# Patient Record
Sex: Female | Born: 1975 | Race: Black or African American | Hispanic: No | Marital: Single | Smoking: Current every day smoker
Health system: Southern US, Community
[De-identification: ages and names within clinical notes are randomized; demographics above are authoritative.]

## PROBLEM LIST (undated history)

## (undated) DIAGNOSIS — D649 Anemia, unspecified: Secondary | ICD-10-CM

## (undated) DIAGNOSIS — G43909 Migraine, unspecified, not intractable, without status migrainosus: Secondary | ICD-10-CM

## (undated) DIAGNOSIS — R161 Splenomegaly, not elsewhere classified: Secondary | ICD-10-CM

## (undated) DIAGNOSIS — I864 Gastric varices: Secondary | ICD-10-CM

## (undated) DIAGNOSIS — K389 Disease of appendix, unspecified: Secondary | ICD-10-CM

## (undated) DIAGNOSIS — K766 Portal hypertension: Secondary | ICD-10-CM

## (undated) DIAGNOSIS — I8500 Esophageal varices without bleeding: Secondary | ICD-10-CM

## (undated) DIAGNOSIS — Q442 Atresia of bile ducts: Secondary | ICD-10-CM

## (undated) DIAGNOSIS — K7460 Unspecified cirrhosis of liver: Secondary | ICD-10-CM

## (undated) HISTORY — DX: Portal hypertension: K76.6

## (undated) HISTORY — DX: Disease of appendix, unspecified: K38.9

## (undated) HISTORY — DX: Gastric varices: I86.4

## (undated) HISTORY — DX: Unspecified cirrhosis of liver: K74.60

## (undated) HISTORY — PX: CHOLECYSTECTOMY: SHX55

## (undated) HISTORY — DX: Splenomegaly, not elsewhere classified: R16.1

## (undated) HISTORY — PX: OTHER SURGICAL HISTORY: SHX169

## (undated) HISTORY — PX: LIVER SURGERY: SHX698

## (undated) HISTORY — DX: Migraine, unspecified, not intractable, without status migrainosus: G43.909

## (undated) HISTORY — PX: HAND SURGERY: SHX662

## (undated) HISTORY — DX: Atresia of bile ducts: Q44.2

## (undated) HISTORY — PX: OVARIAN CYST REMOVAL: SHX89

## (undated) HISTORY — DX: Esophageal varices without bleeding: I85.00

---

## 1998-02-11 ENCOUNTER — Other Ambulatory Visit: Admission: RE | Admit: 1998-02-11 | Discharge: 1998-02-11 | Payer: Self-pay | Admitting: Obstetrics

## 1998-03-12 ENCOUNTER — Ambulatory Visit (HOSPITAL_COMMUNITY): Admission: RE | Admit: 1998-03-12 | Discharge: 1998-03-12 | Payer: Self-pay | Admitting: Obstetrics

## 1998-04-10 ENCOUNTER — Encounter: Payer: Self-pay | Admitting: Obstetrics

## 1998-04-10 ENCOUNTER — Ambulatory Visit (HOSPITAL_COMMUNITY): Admission: RE | Admit: 1998-04-10 | Discharge: 1998-04-10 | Payer: Self-pay | Admitting: Obstetrics

## 1998-04-30 ENCOUNTER — Ambulatory Visit (HOSPITAL_COMMUNITY): Admission: RE | Admit: 1998-04-30 | Discharge: 1998-04-30 | Payer: Self-pay | Admitting: Obstetrics

## 1998-04-30 ENCOUNTER — Encounter: Payer: Self-pay | Admitting: Obstetrics

## 1998-05-02 ENCOUNTER — Inpatient Hospital Stay (HOSPITAL_COMMUNITY): Admission: RE | Admit: 1998-05-02 | Discharge: 1998-05-05 | Payer: Self-pay | Admitting: Obstetrics

## 1999-01-01 ENCOUNTER — Encounter: Payer: Self-pay | Admitting: Obstetrics

## 1999-01-01 ENCOUNTER — Inpatient Hospital Stay (HOSPITAL_COMMUNITY): Admission: AD | Admit: 1999-01-01 | Discharge: 1999-01-01 | Payer: Self-pay | Admitting: Obstetrics

## 1999-01-19 ENCOUNTER — Other Ambulatory Visit: Admission: RE | Admit: 1999-01-19 | Discharge: 1999-01-19 | Payer: Self-pay | Admitting: Obstetrics

## 1999-08-22 ENCOUNTER — Emergency Department (HOSPITAL_COMMUNITY): Admission: EM | Admit: 1999-08-22 | Discharge: 1999-08-22 | Payer: Self-pay | Admitting: *Deleted

## 2000-01-28 ENCOUNTER — Inpatient Hospital Stay (HOSPITAL_COMMUNITY): Admission: AD | Admit: 2000-01-28 | Discharge: 2000-01-28 | Payer: Self-pay | Admitting: *Deleted

## 2000-10-15 ENCOUNTER — Inpatient Hospital Stay (HOSPITAL_COMMUNITY): Admission: EM | Admit: 2000-10-15 | Discharge: 2000-10-19 | Payer: Self-pay | Admitting: Emergency Medicine

## 2000-10-15 ENCOUNTER — Encounter: Payer: Self-pay | Admitting: Family Medicine

## 2000-10-15 ENCOUNTER — Encounter: Payer: Self-pay | Admitting: Emergency Medicine

## 2001-07-26 ENCOUNTER — Ambulatory Visit (HOSPITAL_BASED_OUTPATIENT_CLINIC_OR_DEPARTMENT_OTHER): Admission: RE | Admit: 2001-07-26 | Discharge: 2001-07-26 | Payer: Self-pay | Admitting: Orthopedic Surgery

## 2002-11-01 ENCOUNTER — Inpatient Hospital Stay (HOSPITAL_COMMUNITY): Admission: AD | Admit: 2002-11-01 | Discharge: 2002-11-01 | Payer: Self-pay | Admitting: Obstetrics

## 2002-12-13 ENCOUNTER — Emergency Department (HOSPITAL_COMMUNITY): Admission: EM | Admit: 2002-12-13 | Discharge: 2002-12-13 | Payer: Self-pay

## 2003-01-28 ENCOUNTER — Encounter (INDEPENDENT_AMBULATORY_CARE_PROVIDER_SITE_OTHER): Payer: Self-pay | Admitting: *Deleted

## 2003-01-28 ENCOUNTER — Observation Stay (HOSPITAL_COMMUNITY): Admission: AD | Admit: 2003-01-28 | Discharge: 2003-01-29 | Payer: Self-pay | Admitting: Orthopedic Surgery

## 2003-06-20 ENCOUNTER — Emergency Department (HOSPITAL_COMMUNITY): Admission: AD | Admit: 2003-06-20 | Discharge: 2003-06-20 | Payer: Self-pay | Admitting: Family Medicine

## 2003-06-23 ENCOUNTER — Emergency Department (HOSPITAL_COMMUNITY): Admission: EM | Admit: 2003-06-23 | Discharge: 2003-06-24 | Payer: Self-pay | Admitting: Emergency Medicine

## 2006-03-08 ENCOUNTER — Inpatient Hospital Stay (HOSPITAL_COMMUNITY): Admission: AD | Admit: 2006-03-08 | Discharge: 2006-03-09 | Payer: Self-pay | Admitting: Obstetrics

## 2008-03-10 ENCOUNTER — Emergency Department (HOSPITAL_COMMUNITY): Admission: EM | Admit: 2008-03-10 | Discharge: 2008-03-10 | Payer: Self-pay | Admitting: Emergency Medicine
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
## 2011-06-27 ENCOUNTER — Inpatient Hospital Stay (HOSPITAL_COMMUNITY): Payer: BC Managed Care – PPO

## 2011-06-27 ENCOUNTER — Encounter (HOSPITAL_COMMUNITY): Payer: Self-pay

## 2011-06-27 ENCOUNTER — Inpatient Hospital Stay (HOSPITAL_COMMUNITY)
Admission: AD | Admit: 2011-06-27 | Discharge: 2011-06-27 | Disposition: A | Discharge status: Home / Self Care | Payer: BC Managed Care – PPO | Source: Ambulatory Visit | Attending: Obstetrics | Admitting: Obstetrics

## 2011-06-27 DIAGNOSIS — R109 Unspecified abdominal pain: Secondary | ICD-10-CM | POA: Insufficient documentation

## 2011-06-27 DIAGNOSIS — N83209 Unspecified ovarian cyst, unspecified side: Secondary | ICD-10-CM | POA: Insufficient documentation

## 2011-06-27 DIAGNOSIS — N83201 Unspecified ovarian cyst, right side: Secondary | ICD-10-CM

## 2011-06-27 DIAGNOSIS — R1031 Right lower quadrant pain: Secondary | ICD-10-CM

## 2011-06-27 HISTORY — DX: Anemia, unspecified: D64.9

## 2011-06-27 LAB — URINE MICROSCOPIC-ADD ON

## 2011-06-27 LAB — DIFFERENTIAL
Basophils Absolute: 0 10*3/uL (ref 0.0–0.1)
Basophils Relative: 0 % (ref 0–1)
Eosinophils Absolute: 0 10*3/uL (ref 0.0–0.7)
Eosinophils Relative: 0 % (ref 0–5)
Lymphocytes Relative: 15 % (ref 12–46)
Lymphs Abs: 1.3 10*3/uL (ref 0.7–4.0)
Monocytes Absolute: 0.9 10*3/uL (ref 0.1–1.0)
Monocytes Relative: 10 % (ref 3–12)
Neutro Abs: 6.7 10*3/uL (ref 1.7–7.7)
Neutrophils Relative %: 75 % (ref 43–77)

## 2011-06-27 LAB — WET PREP, GENITAL
Trich, Wet Prep: NONE SEEN
Yeast Wet Prep HPF POC: NONE SEEN

## 2011-06-27 LAB — URINALYSIS, ROUTINE W REFLEX MICROSCOPIC
Glucose, UA: NEGATIVE mg/dL
Ketones, ur: NEGATIVE mg/dL
Nitrite: NEGATIVE
Protein, ur: NEGATIVE mg/dL
Specific Gravity, Urine: 1.025 (ref 1.005–1.030)
Urobilinogen, UA: 2 mg/dL — ABNORMAL HIGH (ref 0.0–1.0)
pH: 5.5 (ref 5.0–8.0)

## 2011-06-27 LAB — CBC
HCT: 38.2 % (ref 36.0–46.0)
Hemoglobin: 13.4 g/dL (ref 12.0–15.0)
MCH: 32.6 pg (ref 26.0–34.0)
MCHC: 35.1 g/dL (ref 30.0–36.0)
MCV: 92.9 fL (ref 78.0–100.0)
Platelets: 86 10*3/uL — ABNORMAL LOW (ref 150–400)
RBC: 4.11 MIL/uL (ref 3.87–5.11)
RDW: 15.3 % (ref 11.5–15.5)
WBC: 9 10*3/uL (ref 4.0–10.5)

## 2011-06-27 LAB — POCT PREGNANCY, URINE: Preg Test, Ur: NEGATIVE

## 2011-06-27 MED ORDER — IBUPROFEN 200 MG PO TABS: 800.0000 mg | ORAL_TABLET | Freq: Three times a day (TID) | ORAL | Status: DC | PRN

## 2011-06-27 MED ORDER — HYDROCODONE-ACETAMINOPHEN 5-500 MG PO TABS: 1.0000 | ORAL_TABLET | Freq: Four times a day (QID) | ORAL | Status: AC | PRN

## 2011-06-27 NOTE — Progress Notes (Signed)
Pt c/o lower left abd pain for the past 10 days since her period stopped.  She took 600mg  ibuprophen @ 1100 without any relief.

## 2011-06-27 NOTE — Progress Notes (Signed)
Sharp abdominal pain had LMP 06/19/11, having vaginal discharge. History of left ovarian cyst, hurting worse on left side, pain is shooting down her leg

## 2011-06-27 NOTE — ED Provider Notes (Signed)
History     Chief Complaint  Patient presents with  . Abdominal Pain  . Vaginal Discharge   HPIJeanne D Christian is 35 y.o. G0P0 Unknown weeks presenting with abdominal pain.  Saw Dr. Gaynell Face told everything was "good" in September.  LMP 06/15/11.  Missed November cycle.  Reports increased pain at the end of Dec cycle.   Had normal flow of 5-6 days.  Pain has continued everyday until today.  Denies vaginal bleeding today.  Has "large discharge with I stand up--its clear".  Sexually active using condoms.  New partner. Hx of ovarian cysts.     Past Medical History  Diagnosis Date  . Enlarged liver     Past Surgical History  Procedure Date  . Liver surgery   . Ovarian cyst removal     No family history on file.  History  Substance Use Topics  . Smoking status: Current Everyday Smoker  . Smokeless tobacco: Not on file  . Alcohol Use: Yes     occasional    Allergies: No Known Allergies  Prescriptions prior to admission  Medication Sig Dispense Refill  . ibuprofen (ADVIL,MOTRIN) 200 MG tablet Take 600 mg by mouth every 6 (six) hours as needed. For pain       . prenatal vitamin w/FE, FA (PRENATAL 1 + 1) 27-1 MG TABS Take 1 tablet by mouth daily.          Review of Systems  Constitutional: Negative for fever and chills.  Respiratory: Negative.   Cardiovascular: Negative.   Gastrointestinal: Positive for abdominal pain (rating 7/10.  intermittent). Negative for nausea and vomiting.  Genitourinary: Negative.        Negative for vaginal bleeding Positive for discharge, no odor   Physical Exam   Blood pressure 138/71, pulse 80, temperature 98.9 F (37.2 C), temperature source Oral, resp. rate 16, height 5' 3.5" (1.613 m), weight 216 lb 6.4 oz (98.158 kg), last menstrual period 06/15/2011.  Physical Exam  Constitutional: She is oriented to person, place, and time. She appears well-developed and well-nourished. No distress.  HENT:  Head: Normocephalic.  Neck: Normal range  of motion.  Cardiovascular: Normal rate.   Respiratory: Effort normal.  GI: Soft. She exhibits no distension and no mass. There is no tenderness. There is no rebound and no guarding.  Genitourinary: Uterus is not enlarged and not tender. Cervix exhibits no motion tenderness, no discharge and no friability. Right adnexum displays no mass, no tenderness and no fullness. Left adnexum displays tenderness. Left adnexum displays no mass and no fullness. No bleeding around the vagina. Vaginal discharge (mod amount of yellow discharge) found.  Neurological: She is alert and oriented to person, place, and time.  Skin: Skin is warm and dry.    MAU Course  Procedures  Gc/CHl culture to lab.  MDM  20:00 care turned over to S. Chase Picket, NP Assessment and Plan    KEY,EVE M 06/27/2011, 3:42 PM   Matt Holmes, NP 06/27/11 1757  US Transvaginal Non-ob  06/27/2011  *RADIOLOGY REPORT*  Clinical Data: Right lower quadrant and pelvic pain.  Dysfunctional uterine bleeding.  LMP 06/15/2011.  TRANSABDOMINAL AND TRANSVAGINAL ULTRASOUND OF PELVIS  Technique:  Both transabdominal and transvaginal ultrasound examinations of the pelvis were performed.  Transabdominal technique was performed for global imaging of the pelvis including uterus, ovaries, adnexal regions, and pelvic cul-de-sac.  It was necessary to proceed with endovaginal exam following the transabdominal exam to visualize the endometrium and ovaries.  Comparison:  03/09/2006  Findings: Uterus:  Normal in size and appearance.  No fibroids or other uterine mass identified.  Endometrium: Normal in thickness and appearance.  Double layer thickness measures 5 mm transvaginally.  Right ovary: A complex cyst is seen which measures 3.2 x 3.3 x 3.9 cm. This has an irregular contour with somewhat thickened wall and several tiny mural nodular foci, best seen on transabdominal sonography.  Left ovary: A complex cyst is seen containing a few thickened internal  septations. An irregular contour with wall thickening and several tiny mural nodular foci are also seen.These were not assessed with color Doppler ultrasound by the sonographer.  This complex cystic lesion measures 4.6 x 4.2 x 3.3 cm.  Other Findings:  No free fluid  IMPRESSION:  1. Bilateral complex ovarian cystic lesions measuring 3.9 cm on the right and 4.6 cm on the left.  Cystic ovarian neoplasms cannot be excluded.  Recommend follow-up ultrasound in 6-12 weeks versus pelvic MRI for further characterization. 2.  No evidence of free fluid. 3.  Normal appearance of the uterus.  Original Report Authenticated By: Danae Orleans, M.D.   US Pelvis Complete  06/27/2011  *RADIOLOGY REPORT*  Clinical Data: Right lower quadrant and pelvic pain.  Dysfunctional uterine bleeding.  LMP 06/15/2011.  TRANSABDOMINAL AND TRANSVAGINAL ULTRASOUND OF PELVIS  Technique:  Both transabdominal and transvaginal ultrasound examinations of the pelvis were performed.  Transabdominal technique was performed for global imaging of the pelvis including uterus, ovaries, adnexal regions, and pelvic cul-de-sac.  It was necessary to proceed with endovaginal exam following the transabdominal exam to visualize the endometrium and ovaries.  Comparison:  03/09/2006  Findings: Uterus:  Normal in size and appearance.  No fibroids or other uterine mass identified.  Endometrium: Normal in thickness and appearance.  Double layer thickness measures 5 mm transvaginally.  Right ovary: A complex cyst is seen which measures 3.2 x 3.3 x 3.9 cm. This has an irregular contour with somewhat thickened wall and several tiny mural nodular foci, best seen on transabdominal sonography.  Left ovary: A complex cyst is seen containing a few thickened internal septations. An irregular contour with wall thickening and several tiny mural nodular foci are also seen.These were not assessed with color Doppler ultrasound by the sonographer.  This complex cystic lesion measures  4.6 x 4.2 x 3.3 cm.  Other Findings:  No free fluid  IMPRESSION:  1. Bilateral complex ovarian cystic lesions measuring 3.9 cm on the right and 4.6 cm on the left.  Cystic ovarian neoplasms cannot be excluded.  Recommend follow-up ultrasound in 6-12 weeks versus pelvic MRI for further characterization. 2.  No evidence of free fluid. 3.  Normal appearance of the uterus.  Original Report Authenticated By: Danae Orleans, M.D.   A/P: 35 y.o. G0P0 with bilateral complex ovarian cysts F/U in Dr. Elsie Stain office, call next week for appointment Rx Motrin and Vicodin #30

## 2011-06-28 LAB — GC/CHLAMYDIA PROBE AMP, GENITAL
Chlamydia, DNA Probe: NEGATIVE
GC Probe Amp, Genital: POSITIVE — AB

## 2012-02-03 ENCOUNTER — Encounter (HOSPITAL_COMMUNITY): Payer: Self-pay

## 2012-02-03 ENCOUNTER — Emergency Department (INDEPENDENT_AMBULATORY_CARE_PROVIDER_SITE_OTHER)
Admission: EM | Admit: 2012-02-03 | Discharge: 2012-02-03 | Disposition: A | Discharge status: Home / Self Care | Payer: BC Managed Care – PPO | Source: Home / Self Care | Attending: Emergency Medicine | Admitting: Emergency Medicine

## 2012-02-03 DIAGNOSIS — G5600 Carpal tunnel syndrome, unspecified upper limb: Secondary | ICD-10-CM

## 2012-02-03 MED ORDER — MELOXICAM 15 MG PO TABS: 15.0000 mg | ORAL_TABLET | Freq: Every day | ORAL | Status: AC

## 2012-02-03 MED ORDER — PREDNISONE 10 MG PO TABS: ORAL_TABLET | ORAL | Status: DC

## 2012-02-03 NOTE — ED Notes (Signed)
Works from home , does keyboard work; c/o for past couple of months, has been having pain in her right arm, worse w certain positions, and activity; became concerned when this began to wake her from her sleep at night. C/o has been hoarse since earlier this week , and has lost her voice; NAD

## 2012-02-03 NOTE — ED Provider Notes (Signed)
Chief Complaint  Patient presents with  . Carpal Tunnel    History of Present Illness:   Dominique Christian is a 36 year old female who has had a 2 month history of pain in the right volar wrist radiating up towards the antecubital fossa. The pain is worse if she picks anything up, twists her wrist, or tightness. She has numbness and tingling in the hand at nighttime which can be relieved by shaking or handling it hang off the bed. 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 She denies any swelling or weakness. There is no neck or shoulder pain, the left wrist and hand are okay.  Review of Systems:  Other than noted above, the patient denies any of the following symptoms: Systemic:  No fevers, chills, sweats, or aches.  No fatigue or tiredness. Musculoskeletal:  No joint pain, arthritis, bursitis, swelling, back pain, or neck pain. Neurological:  No muscular weakness, paresthesias, headache, or trouble with speech or coordination.  No dizziness.   PMFSH:  Past medical history, family history, social history, meds, and allergies were reviewed.  Physical Exam:   Vital signs:  BP 132/80  Pulse 92  Temp 98.4 F (36.9 C) (Oral)  Resp 18  SpO2 100% Gen:  Alert and oriented times 3.  In no distress. Musculoskeletal: There is no swelling or pain to palpation over the carpal tunnel. The wrist has full range of motion but with some pain on dorsiflexion. Tinel's sign was negative. Phalen's sign produced pain but no numbness or tingling. Otherwise, all joints had a full a ROM with no swelling, bruising or deformity.  No edema, pulses full. Extremities were warm and pink.  Capillary refill was brisk.  Skin:  Clear, warm and dry.  No rash. Neuro:  Alert and oriented times 3.  Muscle strength was normal.  Sensation was intact to light touch.   Course in Urgent Care Center:   She was placed in a wrist splint and told to wear this continuously for the  next month.  Assessment:  The encounter diagnosis was Carpal tunnel syndrome.  Plan:   1.  The following meds were prescribed:   New Prescriptions   MELOXICAM (MOBIC) 15 MG TABLET    Take 1 tablet (15 mg total) by mouth daily.   PREDNISONE (DELTASONE) 10 MG TABLET    2 daily for 2 weeks, then 1 daily for 2 weeks   2.  The patient was instructed in symptomatic care, including rest and activity, elevation, application of ice and compression.  Appropriate handouts were given. 3.  The patient was told to return if becoming worse in any way, if no better in 3 or 4 days, and given some red flag symptoms that would indicate earlier return.   4.  The patient was told to follow up with Dr. Izora Ribas if no better in one month.   Dominique Likes, MD 02/03/12 [phone]

## 2012-07-27 LAB — CYTOLOGY - PAP: Pap: NEGATIVE

## 2013-10-02 ENCOUNTER — Emergency Department (HOSPITAL_COMMUNITY): Payer: BC Managed Care – PPO

## 2013-10-02 ENCOUNTER — Encounter (HOSPITAL_COMMUNITY): Payer: Self-pay | Admitting: Emergency Medicine

## 2013-10-02 ENCOUNTER — Inpatient Hospital Stay (HOSPITAL_COMMUNITY)
Admission: EM | Admit: 2013-10-02 | Discharge: 2013-10-05 | DRG: 872 | Disposition: A | Discharge status: Home / Self Care | Payer: BC Managed Care – PPO | Attending: Internal Medicine | Admitting: Internal Medicine

## 2013-10-02 ENCOUNTER — Inpatient Hospital Stay (HOSPITAL_COMMUNITY): Payer: BC Managed Care – PPO

## 2013-10-02 DIAGNOSIS — K7460 Unspecified cirrhosis of liver: Secondary | ICD-10-CM | POA: Diagnosis present

## 2013-10-02 DIAGNOSIS — R7401 Elevation of levels of liver transaminase levels: Secondary | ICD-10-CM | POA: Diagnosis present

## 2013-10-02 DIAGNOSIS — I498 Other specified cardiac arrhythmias: Secondary | ICD-10-CM | POA: Diagnosis present

## 2013-10-02 DIAGNOSIS — R161 Splenomegaly, not elsewhere classified: Secondary | ICD-10-CM | POA: Diagnosis present

## 2013-10-02 DIAGNOSIS — N390 Urinary tract infection, site not specified: Secondary | ICD-10-CM

## 2013-10-02 DIAGNOSIS — D689 Coagulation defect, unspecified: Secondary | ICD-10-CM | POA: Diagnosis present

## 2013-10-02 DIAGNOSIS — R109 Unspecified abdominal pain: Secondary | ICD-10-CM

## 2013-10-02 DIAGNOSIS — K766 Portal hypertension: Secondary | ICD-10-CM | POA: Diagnosis present

## 2013-10-02 DIAGNOSIS — E875 Hyperkalemia: Secondary | ICD-10-CM | POA: Diagnosis present

## 2013-10-02 DIAGNOSIS — Z720 Tobacco use: Secondary | ICD-10-CM | POA: Diagnosis present

## 2013-10-02 DIAGNOSIS — K5900 Constipation, unspecified: Secondary | ICD-10-CM | POA: Diagnosis present

## 2013-10-02 DIAGNOSIS — R740 Nonspecific elevation of levels of transaminase and lactic acid dehydrogenase [LDH]: Secondary | ICD-10-CM

## 2013-10-02 DIAGNOSIS — R12 Heartburn: Secondary | ICD-10-CM | POA: Diagnosis present

## 2013-10-02 DIAGNOSIS — R6510 Systemic inflammatory response syndrome (SIRS) of non-infectious origin without acute organ dysfunction: Secondary | ICD-10-CM

## 2013-10-02 DIAGNOSIS — A4151 Sepsis due to Escherichia coli [E. coli]: Principal | ICD-10-CM | POA: Diagnosis present

## 2013-10-02 DIAGNOSIS — F17200 Nicotine dependence, unspecified, uncomplicated: Secondary | ICD-10-CM

## 2013-10-02 DIAGNOSIS — R17 Unspecified jaundice: Secondary | ICD-10-CM

## 2013-10-02 DIAGNOSIS — Z79899 Other long term (current) drug therapy: Secondary | ICD-10-CM

## 2013-10-02 DIAGNOSIS — A419 Sepsis, unspecified organism: Secondary | ICD-10-CM | POA: Diagnosis present

## 2013-10-02 DIAGNOSIS — R7989 Other specified abnormal findings of blood chemistry: Secondary | ICD-10-CM | POA: Diagnosis present

## 2013-10-02 DIAGNOSIS — R7402 Elevation of levels of lactic acid dehydrogenase (LDH): Secondary | ICD-10-CM | POA: Diagnosis present

## 2013-10-02 DIAGNOSIS — K219 Gastro-esophageal reflux disease without esophagitis: Secondary | ICD-10-CM | POA: Diagnosis present

## 2013-10-02 LAB — COMPREHENSIVE METABOLIC PANEL
ALK PHOS: 469 U/L — AB (ref 39–117)
ALT: 63 U/L — ABNORMAL HIGH (ref 0–35)
AST: 132 U/L — ABNORMAL HIGH (ref 0–37)
Albumin: 3.2 g/dL — ABNORMAL LOW (ref 3.5–5.2)
BUN: 7 mg/dL (ref 6–23)
CHLORIDE: 100 meq/L (ref 96–112)
CO2: 18 mEq/L — ABNORMAL LOW (ref 19–32)
Calcium: 9.1 mg/dL (ref 8.4–10.5)
Creatinine, Ser: 0.63 mg/dL (ref 0.50–1.10)
GLUCOSE: 108 mg/dL — AB (ref 70–99)
POTASSIUM: 5.8 meq/L — AB (ref 3.7–5.3)
SODIUM: 133 meq/L — AB (ref 137–147)
Total Bilirubin: 3.5 mg/dL — ABNORMAL HIGH (ref 0.3–1.2)
Total Protein: 8.2 g/dL (ref 6.0–8.3)

## 2013-10-02 LAB — HEPATITIS PANEL, ACUTE
HCV Ab: NEGATIVE
HEP A IGM: NONREACTIVE
Hep B C IgM: NONREACTIVE
Hepatitis B Surface Ag: NEGATIVE

## 2013-10-02 LAB — CBC WITH DIFFERENTIAL/PLATELET
Basophils Absolute: 0 10*3/uL (ref 0.0–0.1)
Basophils Relative: 0 % (ref 0–1)
Eosinophils Absolute: 0 10*3/uL (ref 0.0–0.7)
Eosinophils Relative: 0 % (ref 0–5)
HCT: 38.6 % (ref 36.0–46.0)
HEMOGLOBIN: 13.7 g/dL (ref 12.0–15.0)
LYMPHS ABS: 1.1 10*3/uL (ref 0.7–4.0)
LYMPHS PCT: 7 % — AB (ref 12–46)
MCH: 32.1 pg (ref 26.0–34.0)
MCHC: 35.5 g/dL (ref 30.0–36.0)
MCV: 90.4 fL (ref 78.0–100.0)
MONOS PCT: 8 % (ref 3–12)
Monocytes Absolute: 1.2 10*3/uL — ABNORMAL HIGH (ref 0.1–1.0)
NEUTROS PCT: 86 % — AB (ref 43–77)
Neutro Abs: 13.3 10*3/uL — ABNORMAL HIGH (ref 1.7–7.7)
PLATELETS: 83 10*3/uL — AB (ref 150–400)
RBC: 4.27 MIL/uL (ref 3.87–5.11)
RDW: 15.8 % — ABNORMAL HIGH (ref 11.5–15.5)
WBC: 15.6 10*3/uL — AB (ref 4.0–10.5)

## 2013-10-02 LAB — URINE MICROSCOPIC-ADD ON

## 2013-10-02 LAB — URINALYSIS, ROUTINE W REFLEX MICROSCOPIC
Glucose, UA: NEGATIVE mg/dL
HGB URINE DIPSTICK: NEGATIVE
KETONES UR: NEGATIVE mg/dL
Nitrite: POSITIVE — AB
Protein, ur: NEGATIVE mg/dL
SPECIFIC GRAVITY, URINE: 1.025 (ref 1.005–1.030)
UROBILINOGEN UA: 1 mg/dL (ref 0.0–1.0)
pH: 5.5 (ref 5.0–8.0)

## 2013-10-02 LAB — I-STAT CHEM 8, ED
BUN: 5 mg/dL — AB (ref 6–23)
CREATININE: 0.7 mg/dL (ref 0.50–1.10)
Calcium, Ion: 1.05 mmol/L — ABNORMAL LOW (ref 1.12–1.23)
Chloride: 106 mEq/L (ref 96–112)
Glucose, Bld: 103 mg/dL — ABNORMAL HIGH (ref 70–99)
HCT: 39 % (ref 36.0–46.0)
Hemoglobin: 13.3 g/dL (ref 12.0–15.0)
Potassium: 4.3 mEq/L (ref 3.7–5.3)
Sodium: 137 mEq/L (ref 137–147)
TCO2: 22 mmol/L (ref 0–100)

## 2013-10-02 LAB — LIPASE, BLOOD: Lipase: 31 U/L (ref 11–59)

## 2013-10-02 LAB — I-STAT CG4 LACTIC ACID, ED: LACTIC ACID, VENOUS: 1.72 mmol/L (ref 0.5–2.2)

## 2013-10-02 LAB — PREGNANCY, URINE: Preg Test, Ur: NEGATIVE

## 2013-10-02 LAB — MAGNESIUM: Magnesium: 1.6 mg/dL (ref 1.5–2.5)

## 2013-10-02 MED ORDER — HEPARIN SODIUM (PORCINE) 5000 UNIT/ML IJ SOLN
5000.0000 [IU] | Freq: Three times a day (TID) | INTRAMUSCULAR | Status: DC
  Administered 2013-10-02 – 2013-10-03 (×2): 5000 [IU] via SUBCUTANEOUS
  Filled 2013-10-02 (×5): qty 1

## 2013-10-02 MED ORDER — GADOBENATE DIMEGLUMINE 529 MG/ML IV SOLN
20.0000 mL | Freq: Once | INTRAVENOUS | Status: AC | PRN
  Administered 2013-10-02: 20 mL via INTRAVENOUS

## 2013-10-02 MED ORDER — IOHEXOL 300 MG/ML  SOLN
100.0000 mL | Freq: Once | INTRAMUSCULAR | Status: AC | PRN
  Administered 2013-10-02: 100 mL via INTRAVENOUS

## 2013-10-02 MED ORDER — MORPHINE SULFATE 2 MG/ML IJ SOLN
1.0000 mg | INTRAMUSCULAR | Status: DC | PRN
  Administered 2013-10-02: 1 mg via INTRAVENOUS
  Filled 2013-10-02: qty 1

## 2013-10-02 MED ORDER — SODIUM CHLORIDE 0.9 % IV BOLUS (SEPSIS): 1000.0000 mL | Freq: Once | INTRAVENOUS | Status: DC

## 2013-10-02 MED ORDER — ONDANSETRON HCL 4 MG/2ML IJ SOLN
4.0000 mg | INTRAMUSCULAR | Status: AC
  Administered 2013-10-02: 4 mg via INTRAVENOUS
  Filled 2013-10-02: qty 2

## 2013-10-02 MED ORDER — DOCUSATE SODIUM 100 MG PO CAPS
100.0000 mg | ORAL_CAPSULE | Freq: Two times a day (BID) | ORAL | Status: DC
  Administered 2013-10-02 – 2013-10-05 (×6): 100 mg via ORAL
  Filled 2013-10-02 (×8): qty 1

## 2013-10-02 MED ORDER — ONDANSETRON HCL 4 MG/2ML IJ SOLN
4.0000 mg | Freq: Once | INTRAMUSCULAR | Status: AC
  Administered 2013-10-02: 4 mg via INTRAVENOUS
  Filled 2013-10-02: qty 2

## 2013-10-02 MED ORDER — POLYETHYLENE GLYCOL 3350 17 G PO PACK
17.0000 g | PACK | Freq: Every day | ORAL | Status: DC
  Administered 2013-10-02 – 2013-10-05 (×4): 17 g via ORAL
  Filled 2013-10-02 (×4): qty 1

## 2013-10-02 MED ORDER — ACETAMINOPHEN 650 MG RE SUPP: 650.0000 mg | Freq: Four times a day (QID) | RECTAL | Status: DC | PRN

## 2013-10-02 MED ORDER — ACETAMINOPHEN 325 MG PO TABS
650.0000 mg | ORAL_TABLET | Freq: Once | ORAL | Status: AC
  Administered 2013-10-02: 650 mg via ORAL
  Filled 2013-10-02: qty 2

## 2013-10-02 MED ORDER — MORPHINE SULFATE 4 MG/ML IJ SOLN
4.0000 mg | Freq: Once | INTRAMUSCULAR | Status: AC
  Administered 2013-10-02: 4 mg via INTRAVENOUS
  Filled 2013-10-02: qty 1

## 2013-10-02 MED ORDER — PIPERACILLIN-TAZOBACTAM 3.375 G IVPB
3.3750 g | Freq: Three times a day (TID) | INTRAVENOUS | Status: DC
  Administered 2013-10-02 – 2013-10-04 (×7): 3.375 g via INTRAVENOUS
  Filled 2013-10-02 (×9): qty 50

## 2013-10-02 MED ORDER — SODIUM CHLORIDE 0.9 % IV SOLN
1000.0000 mL | Freq: Once | INTRAVENOUS | Status: AC
  Administered 2013-10-02: 1000 mL via INTRAVENOUS

## 2013-10-02 MED ORDER — IOHEXOL 300 MG/ML  SOLN
50.0000 mL | Freq: Once | INTRAMUSCULAR | Status: AC | PRN
  Administered 2013-10-02: 50 mL via ORAL

## 2013-10-02 MED ORDER — SODIUM CHLORIDE 0.9 % IV SOLN
3.0000 g | Freq: Once | INTRAVENOUS | Status: AC
  Administered 2013-10-02: 3 g via INTRAVENOUS
  Filled 2013-10-02: qty 3

## 2013-10-02 MED ORDER — PANTOPRAZOLE SODIUM 40 MG PO TBEC
40.0000 mg | DELAYED_RELEASE_TABLET | Freq: Two times a day (BID) | ORAL | Status: DC
  Administered 2013-10-02 – 2013-10-05 (×6): 40 mg via ORAL
  Filled 2013-10-02 (×10): qty 1

## 2013-10-02 MED ORDER — ONDANSETRON HCL 4 MG/2ML IJ SOLN: 4.0000 mg | Freq: Four times a day (QID) | INTRAMUSCULAR | Status: DC | PRN

## 2013-10-02 MED ORDER — SODIUM CHLORIDE 0.9 % IV BOLUS (SEPSIS)
1000.0000 mL | Freq: Once | INTRAVENOUS | Status: AC
Start: 2013-10-02 — End: 2013-10-02
  Administered 2013-10-02: 1000 mL via INTRAVENOUS

## 2013-10-02 MED ORDER — ACETAMINOPHEN 325 MG PO TABS
650.0000 mg | ORAL_TABLET | Freq: Four times a day (QID) | ORAL | Status: DC | PRN
  Administered 2013-10-02 – 2013-10-04 (×5): 650 mg via ORAL
  Filled 2013-10-02 (×5): qty 2

## 2013-10-02 MED ORDER — ONDANSETRON HCL 4 MG PO TABS: 4.0000 mg | ORAL_TABLET | Freq: Four times a day (QID) | ORAL | Status: DC | PRN

## 2013-10-02 MED ORDER — SODIUM CHLORIDE 0.9 % IV SOLN
1000.0000 mL | INTRAVENOUS | Status: DC
  Administered 2013-10-02 – 2013-10-03 (×3): 1000 mL via INTRAVENOUS

## 2013-10-02 NOTE — H&P (Signed)
[facility] History and Physical  VILDA ZOLLNER ZOX:[id number] DOB: 20-Oct-1975 DOA: 10/02/2013  Referring physician: Dr. Jodi Mourning PCP: No primary provider on file.   Chief Complaint: abd. pain, nausea, vomiting   HPI: Dominique Christian is a 38 y.o. 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 Patient reports symptoms has been present for the last day or two prior to admission. Patient endorses associated fever and generalized not feeling well sensation. Denies cough, SOB, CP, hematemesis, melena, hematochezia, HA's vision changes or any other acute complaints. In ED patient received IVF's and CT scan of her abdomen demonstrated cirrhosis, portal HTN and splenomegaly; blood work also demonstrated elevated LFT's, leukocytosis (15,000 range), elevated bilirubin, positive icterus and UA suggesting UTI.   Review of Systems:  Negative except as mentioned don HPI.  Past Medical History  Diagnosis Date  . Enlarged liver   . Anemia    Past Surgical History  Procedure Laterality Date  . Liver surgery    . Ovarian cyst removal    . Knee sugery      rod from knee to ankle,   . Hand surgery      rod from hand to elbow.   Social History:  reports that she has been smoking Cigarettes.  She has a 2.5 pack-year smoking history. She has never used smokeless tobacco. She reports that she does not drink alcohol or use illicit drugs.  No Known Allergies  Family History  Problem Relation Age of Onset  . Hypertension Mother   . Hypertension Sister   . Cancer Maternal Grandmother      Prior to Admission medications   Medication Sig Start Date End Date Taking? Authorizing Provider  acetaminophen (TYLENOL) 500 MG tablet Take 1,000 mg by mouth every 6 (six) hours as needed (pain).   Yes Historical Provider, MD    omeprazole (PRILOSEC OTC) 20 MG tablet Take 20 mg by mouth daily.   Yes Historical Provider, MD  ranitidine (ZANTAC) 150 MG capsule Take 150 mg by mouth 2 (two) times daily.   Yes Historical Provider, MD   Physical Exam: Filed Vitals:   10/02/13 1107  BP: 145/87  Pulse: 110  Temp: 100.3 F (37.9 C)  Resp: 18    BP 145/87  Pulse 110  Temp(Src) 100.3 F (37.9 C) (Oral)  Resp 18  Ht 5\' 3"  (1.6 m)  Wt 98 kg (216 lb 0.8 oz)  BMI 38.28 kg/m2  SpO2 97%  General:  Appears calm and comfortable; warm to touch Eyes: PERRL, normal lids, irises & conjunctiva ENT: grossly normal hearing, poor dentition, no erythema or exudates; dry MM; no drainage out of ears or nostrils Neck: no LAD, masses or thyromegaly, no JVD Cardiovascular: tachycardia, no m/r/g. No LE edema. Respiratory: CTA bilaterally, no w/r/r. Normal respiratory effort. Abdomen: soft, no distension, tender to palpation epigastric, RUQ and suprapubic area, no guarding and with positive BS Skin: no rash or induration seen on exam Musculoskeletal: grossly normal tone BUE/BLE Psychiatric: grossly normal mood and affect, speech fluent and appropriate Neurologic: grossly non-focal.          Labs on Admission:  Basic Metabolic Panel:  Recent Labs Lab 10/02/13 0533 10/02/13 0924  NA 133* 137  K 5.8* 4.3  CL 100 106  CO2 18*  --   GLUCOSE 108* 103*  BUN 7 5*  CREATININE 0.63 0.70  CALCIUM 9.1  --    Liver Function Tests:  Recent Labs Lab 10/02/13 0533  AST 132*  ALT 63*  ALKPHOS 469*  BILITOT 3.5*  PROT 8.2  ALBUMIN 3.2*    Recent Labs Lab 10/02/13 0533  LIPASE 31   CBC:  Recent Labs Lab 10/02/13 0533 10/02/13 0924  WBC 15.6*  --   NEUTROABS 13.3*  --   HGB 13.7 13.3  HCT 38.6 39.0  MCV 90.4  --   PLT 83*  --    Radiological Exams on Admission: Dg Chest 2 View  10/02/2013   CLINICAL DATA:  Shortness of breath, fever.  EXAM: CHEST  2 VIEW  COMPARISON:  None available for comparison at time of  study interpretation.  FINDINGS: Cardiomediastinal silhouette is unremarkable. The lungs are clear without pleural effusions or focal consolidations. Trachea projects midline and there is no pneumothorax. Soft tissue planes and included osseous structures are non-suspicious.  IMPRESSION: No acute cardiopulmonary process.   Electronically Signed   By: Awilda Metroourtnay  Bloomer   On: 10/02/2013 06:57   Ct Abdomen Pelvis W Contrast  10/02/2013   CLINICAL DATA:  Increasing right lower abdominal pain with fever. Elevated white blood cell count.  EXAM: CT ABDOMEN AND PELVIS WITH CONTRAST  TECHNIQUE: Multidetector CT imaging of the abdomen and pelvis was performed using the standard protocol following bolus administration of intravenous contrast.  CONTRAST:  100mL OMNIPAQUE IOHEXOL 300 MG/ML  SOLN  COMPARISON:  US TRANSVAGINAL NON-OB dated 06/27/2011  FINDINGS: Lung Bases: Clear.  Liver: Nodular contour of the liver consistent with hepatic cirrhosis. No focal mass lesion. Hypoplastic left hepatic lobe. Question partial hepatectomy.  Spleen:  Splenomegaly with 15 cm splenic span.  Gallbladder: Surgically absent compatible with prior cholecystectomy. Scarring in the right abdominal wall suggest prior open cholecystectomy.  Common bile duct: There is no dilation of the common bile duct. In the porta hepatis, 2 tiny loculated gas are present, probably representing pneumobilia associated with sphincterotomy (image 20 series 2). No intra-abdominal free air.  Pancreas:  Normal.  Adrenal glands:  Normal bilaterally.  Kidneys: Normal enhancement. Both ureters appear within normal limits. Negative for calculi.  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 There are no inflammatory changes of stomach.  Small bowel: Duodenum appears normal. Small bowel is within normal limits without a obstruction or inflammatory changes. No mesenteric adenopathy.  Colon: Normal appendix. Tiny  appendicolith is present at the tip of the appendix. Colonic diverticulosis without diverticulitis. The sigmoid colon is decompressed. Mild wall thickening of the sigmoid is probably due to in underdistention rather than colitis. Mild bowel wall thickening can also be associated with cirrhosis.  Pelvic Genitourinary: Urinary bladder appears normal, partially collapsed. Uterus and adnexa have a normal physiologic appearance.  Bones: No aggressive osseous lesions. 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  Vasculature: Minimal atherosclerosis.  Portal venous hypertension.  Body Wall: Scarring in the right abdominal wall. Mild edema in the posterior subcutaneous fat.  IMPRESSION: 1. Mild colonic mural thickening is probably secondary to nondistention and cirrhosis. Colitis could produce a similar appearance. 2. Hepatic cirrhosis, splenomegaly and portal venous hypertension. 3. Cholecystectomy. 4. Tiny amount of gas in the porta hepatis is probably within the common bile duct and represents pneumobilia, likely associated with prior surgery for biliary atresia. There is no intra-abdominal free air to suggest perforated viscus.   Electronically Signed   By: Andreas NewportGeoffrey  Lamke M.D.   On: 10/02/2013  07:12    EKG:  Sinus tachycardia, no ischemic abnormalities.  Assessment/Plan 1-SIRS/early Sepsis: due to presumed UTI -patient will be admitted to med surg bed -IVF's resuscitation -PRN antypiretics and analgesics -IV zosyn dose per pharmacy -supportive care  2-UTI (urinary tract infection): will follow urine culture and mentioned above will treat with zosyn  3-Bilirubinemia, transaminitis and cirrhosis on CT: patient unaware of having cirrhosis; no history heavy alcohol drinking. Had hx of biliary atresia and gallbladder removal when she was 1-2 months old. -provide IVF's -no Stone or bile duct dilatation seen on CT -zosyn will cover GI tract well -will check hepatitis panel (as patient complaining  of N/V abd pain and fever) -GI has been consulted -lipase WNL -follow CMET in am  4-Abdominal pain: as mentioned above could be due to UTI, worsening GERD or ongoing GI problems. -will treat UTI with zosyn (this will cover any early cholangitis or other GI problems) -continue PPI BID -will keep on full liquid diet  5-Tobacco abuse: cessation counseling provided. Patient declines nicotine patch  6-GERD (gastroesophageal reflux disease): continue PPI (will adjust to BID)  7-hyperkalemia: due to hemolysis. Repeated lab WNL.  8-leukocytosis: due to #1. Continue IVF's and antibiotics -will follow CBC in am to assess WBC's trend  DVT: heparin  Gastroenterology ([facility])  Code Status: Full Family Communication: no family at bedside Disposition Plan: LOS > 2 midnights, inpatient and med-surg bed  Time spent: 50 minutes  Vassie Loll [facility] Pager [phone]

## 2013-10-02 NOTE — Progress Notes (Signed)
UR completed 

## 2013-10-02 NOTE — Progress Notes (Signed)
ANTIBIOTIC CONSULT NOTE - INITIAL  Pharmacy Consult for Zosyn Indication: UTI, Sepsis  No Known Allergies  Patient Measurements:     Vital Signs: Temp: 100.2 F (37.9 C) (03/31 0643) Temp src: Oral (03/31 0643) BP: 131/63 mmHg (03/31 0742) Pulse Rate: 115 (03/31 0742) Intake/Output from previous day:   Intake/Output from this shift:    Labs:  Recent Labs  10/02/13 0533  WBC 15.6*  HGB 13.7  PLT 83*  CREATININE 0.63   The CrCl is unknown because both a height and weight (above a minimum accepted value) are required for this calculation. CrCl 105 ml/min/1.4273m2 (normalized)  No results found for this basename: VANCOTROUGH, VANCOPEAK, VANCORANDOM, GENTTROUGH, GENTPEAK, GENTRANDOM, TOBRATROUGH, TOBRAPEAK, TOBRARND, AMIKACINPEAK, AMIKACINTROU, AMIKACIN,  in the last 72 hours   Microbiology: No results found for this or any previous visit (from the past 720 hour(s)). 3/31 urine cx: pending  Medical History: Past Medical History  Diagnosis Date  . Enlarged liver   . Anemia     Medications:  Scheduled:  .  morphine injection  4 mg Intravenous Once  . ondansetron (ZOFRAN) IV  4 mg Intravenous Once   Infusions:  . sodium chloride    . sodium chloride      Assessment: 38 yo female presents to ED 3/31 with fever, abdominal pain and tachycardia. She was given Unasyn 3g today ~7am. Urinalysis consistent with infection, culture pending. CT abdomen is also pending. Patient being admitted and Pharmacy consulted to dose Zosyn for presumed urosepsis.  Goal of Therapy:  Eradication of infection, dose per renal function  Plan:  Zosyn 3.375gm IV q8h (4hr extended infusions) Follow up renal function & cultures, de-escalation  Loralee PacasErin Nelma Phagan, PharmD, BCPS Pager: [phone] 10/02/2013,9:04 AM

## 2013-10-02 NOTE — Consult Note (Signed)
Consultation  Referring Provider:  [facility]  Primary Care Physician:  No primary provider on file. Primary Gastroenterologist: Delfin Edis, MD (15 years ago)        Reason for Consultation:  Cirrhosis, (new diagnosis)             HPI:   Dominique Christian is a 38 y.o. female with a history of biliary atresia, s/p biliary surgery as infant ( ?hepaticojejunostomy ) . She has done well from GI standpoint with the exception of constipation.   Patient admitted with high fever, upper abdominal pain, constipation. No nausea or vomiting. No rectal bleeding. She has had similar episodes of this pain before (without fever), mainly associated with constipation. WBC 15.76. Lipase normal. Alk phos 476, bilirubine 3.5, AST 132, ALT 63.  CTscan reveals cirrhosis with portal HTN. Urinalysis suggestive of UTI.    PMH: Biliary atresia GERD  Past Surgical History  Procedure Laterality Date  . Liver surgery    . Ovarian cyst removal    . Knee sugery      rod from knee to ankle,   . Hand surgery      rod from hand to elbow.    Family History  Problem Relation Age of Onset  . Hypertension Mother   . Hypertension Sister   . Cancer Maternal Grandmother    No colon cancer  History  Substance Use Topics  . Smoking status: Current Every Day Smoker -- 0.25 packs/day for 10 years    Types: Cigarettes  . Smokeless tobacco: Never Used  . Alcohol Use: No     Comment: former    Prior to Admission medications   Medication Sig Start Date End Date Taking? Authorizing Provider  acetaminophen (TYLENOL) 500 MG tablet Take 1,000 mg by mouth every 6 (six) hours as needed (pain).   Yes Historical Provider, MD  omeprazole (PRILOSEC OTC) 20 MG tablet Take 20 mg by mouth daily.   Yes Historical Provider, MD  ranitidine (ZANTAC) 150 MG capsule Take 150 mg by mouth 2 (two) times daily.   Yes Historical Provider, MD    Current Facility-Administered Medications  Medication Dose Route Frequency Provider  Last Rate Last Dose  . 0.9 %  sodium chloride infusion  1,000 mL Intravenous Continuous Antonietta Breach, PA-C 125 mL/hr at 10/02/13 1334 1,000 mL at 10/02/13 1334  . acetaminophen (TYLENOL) tablet 650 mg  650 mg Oral Q6H PRN Barton Dubois, MD       Or  . acetaminophen (TYLENOL) suppository 650 mg  650 mg Rectal Q6H PRN Barton Dubois, MD      . heparin injection 5,000 Units  5,000 Units Subcutaneous 3 times per day Barton Dubois, MD      . morphine 2 MG/ML injection 1 mg  1 mg Intravenous Q3H PRN Barton Dubois, MD      . ondansetron [facility]) tablet 4 mg  4 mg Oral Q6H PRN Barton Dubois, MD       Or  . ondansetron [facility]) injection 4 mg  4 mg Intravenous Q6H PRN Barton Dubois, MD      . pantoprazole (PROTONIX) EC tablet 40 mg  40 mg Oral BID WC Barton Dubois, MD      . piperacillin-tazobactam (ZOSYN) IVPB 3.375 g  3.375 g Intravenous Q8H Emiliano Dyer, RPH        Allergies as of 10/02/2013  . (No Known Allergies)    Review of Systems:    All systems reviewed and negative  except where noted in HPI.   Physical Exam:  Vital signs in last 24 hours: Temp:  [100.2 F (37.9 C)-101.3 F (38.5 C)] 100.3 F (37.9 C) (03/31 1107) Pulse Rate:  [110-131] 110 (03/31 1107) Resp:  [16-19] 18 (03/31 1107) BP: (125-145)/(63-87) 145/87 mmHg (03/31 1107) SpO2:  [96 %-99 %] 97 % (03/31 1107) Weight:  [216 lb 0.8 oz (98 kg)] 216 lb 0.8 oz (98 kg) (03/31 1107) Last BM Date: 09/30/13 General:   [name] in NAD Head:  Normocephalic and atraumatic. Eyes:   No icterus.   Conjunctiva pink. Ears:  Normal auditory acuity. Neck:  Supple; no masses felt Lungs:  Respirations even and unlabored. Lungs clear to auscultation bilaterally.   No wheezes, crackles, or rhonchi.  Heart:  Regular rate and rhythm. Abdomen:  Soft, nondistended, mild to moderate epigastric tenderness. Normal bowel sounds. No appreciable masses.  Rectal:  Not performed.  Msk:  Symmetrical without gross deformities.    Extremities:  Without edema. Neurologic:  Alert and  oriented x4;  grossly normal neurologically. Skin:  Intact without significant lesions or rashes. Cervical Nodes:  No significant cervical adenopathy. Psych:  Alert and cooperative. Normal affect.  LAB RESULTS:  Recent Labs  10/02/13 0533 10/02/13 0924  WBC 15.6*  --   HGB 13.7 13.3  HCT 38.6 39.0  PLT 83*  --    BMET  Recent Labs  10/02/13 0533 10/02/13 0924  NA 133* 137  K 5.8* 4.3  CL 100 106  CO2 18*  --   GLUCOSE 108* 103*  BUN 7 5*  CREATININE 0.63 0.70  CALCIUM 9.1  --    LFT  Recent Labs  10/02/13 0533  PROT 8.2  ALBUMIN 3.2*  AST 132*  ALT 63*  ALKPHOS 469*  BILITOT 3.5*    STUDIES: Dg Chest 2 View  10/02/2013   CLINICAL DATA:  Shortness of breath, fever.  EXAM: CHEST  2 VIEW  COMPARISON:  None available for comparison at time of study interpretation.  FINDINGS: Cardiomediastinal silhouette is unremarkable. The lungs are clear without pleural effusions or focal consolidations. Trachea projects midline and there is no pneumothorax. Soft tissue planes and included osseous structures are non-suspicious.  IMPRESSION: No acute cardiopulmonary process.   Electronically Signed   By: Elon Alas   On: 10/02/2013 06:57   Ct Abdomen Pelvis W Contrast  10/02/2013   CLINICAL DATA:  Increasing right lower abdominal pain with fever. Elevated white blood cell count.  EXAM: CT ABDOMEN AND PELVIS WITH CONTRAST  TECHNIQUE: Multidetector CT imaging of the abdomen and pelvis was performed using the standard protocol following bolus administration of intravenous contrast.  CONTRAST:  142mL OMNIPAQUE IOHEXOL 300 MG/ML  SOLN  COMPARISON:  US TRANSVAGINAL NON-OB dated 06/27/2011  FINDINGS: Lung Bases: Clear.  Liver: Nodular contour of the liver consistent with hepatic cirrhosis. No focal mass lesion. Hypoplastic left hepatic lobe. Question partial hepatectomy.  Spleen:  Splenomegaly with 15 cm splenic span.  Gallbladder:  Surgically absent compatible with prior cholecystectomy. Scarring in the right abdominal wall suggest prior open cholecystectomy.  Common bile duct: There is no dilation of the common bile duct. In the porta hepatis, 2 tiny loculated gas are present, probably representing pneumobilia associated with sphincterotomy (image 20 series 2). No intra-abdominal free air.  Pancreas:  Normal.  Adrenal glands:  Normal bilaterally.  Kidneys: Normal enhancement. Both ureters appear within normal limits. Negative for calculi.  Stomach: Esophageal and gastric varices are present associated with portal  venous hypertension. There are no inflammatory changes of stomach.  Small bowel: Duodenum appears normal. Small bowel is within normal limits without a obstruction or inflammatory changes. No mesenteric adenopathy.  Colon: Normal appendix. Tiny appendicolith is present at the tip of the appendix. Colonic diverticulosis without diverticulitis. The sigmoid colon is decompressed. Mild wall thickening of the sigmoid is probably due to in underdistention rather than colitis. Mild bowel wall thickening can also be associated with cirrhosis.  Pelvic Genitourinary: Urinary bladder appears normal, partially collapsed. Uterus and adnexa have a normal physiologic appearance.  Bones: No aggressive osseous lesions. 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  Vasculature: Minimal atherosclerosis.  Portal venous hypertension.  Body Wall: Scarring in the right abdominal wall. Mild edema in the posterior subcutaneous fat.  IMPRESSION: 1. Mild colonic mural thickening is probably secondary to nondistention and cirrhosis. Colitis could produce a similar appearance. 2. Hepatic cirrhosis, splenomegaly and portal venous hypertension. 3. Cholecystectomy. 4. Tiny amount of gas in the porta hepatis is probably within the common bile duct and represents pneumobilia, likely associated with prior surgery for biliary atresia. There is no  intra-abdominal free air to suggest perforated viscus.   Electronically Signed   By: Dereck Ligas M.D.   On: 10/02/2013 07:12   PREVIOUS ENDOSCOPIES:            none   Impression / Plan:   90. 38 year old female with cirrhosis complicated by portal HTN (CTscan). Cirrhosis newly diagnosed, likely consequence of biliary atresia / resection as infant.   2.Fever and abdominal pain. LFTs abnormal (predominantly cholestatic). She is s/p cholecystectomy as infant. No ascites on imaging so SBP not being considered. For further evaluation of symptoms, LFTs and anatomy will schedule her for MRCP. Continue Zosyn.   Check am LFTs as well as an INR.   Thanks   LOS: 0 days   Tye Savoy  10/02/2013, 4:05 PM   ________________________________________________________________________  Velora Heckler GI MD note:  I personally examined the patient, reviewed the data and agree with the assessment and plan described above.  Significant biliary history with likely hepatico-jejunostomy as an infant for biliary atresia at [facility].  She tells me her LFTs are always elevated.  She understood that she was almost defintely get cirrhosis as she aged and would eventually need liver transplant. 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  I think MRI/MRCP can be helpful here to understand her anatomy best.  Not sure that ERCP is warranted currently or that it is even technically possible after her remote biliary surgery.  For now, IV abx.  She will need more evaluation of her cirrhosis once her acute illness is resolved.  She will need eventual referral to liver transplant center.  Much of this can be as out patient, will follow her for now.   Owens Loffler, MD [facility] Pager [phone]

## 2013-10-02 NOTE — ED Notes (Signed)
Report called to Katherine, RN.

## 2013-10-02 NOTE — ED Notes (Signed)
Pt not able to urinate.   

## 2013-10-02 NOTE — ED Provider Notes (Signed)
Discussed case with Antonietta Breach, PA-C. Transfer of care from [facility], PA-C at change of shift.   Dominique Christian is a 38 y/o F with PMHx of enlarged liver with history of liver surgery, anemia, and ovarian cyst removal presenting to the ED with RLQ and epigastric pain that started yesterday with associated fever, headaches, and chills. Tylenol and cool bath used to cool herself down. Patient given Tylenol in the ED setting.   Upon arrival to the ED patient was febrile and tachy - 101.31F, 131 bpm, blood pressure 125/73.  Sepsis work-up ordered by previous care provider.   Plan: CT abdomen and pelvis with contrast, RUQ [place] to identify gallbladder. 3 grams of Unasyn started in the ED setting. Started on IV fluids.   6:37 AM Patient assessed by this provider. Described the abdominal pain as a punching sensation that is constant with localization to the RLQ and epigastric region, reported that the pain radiates up the right side from the RLQ. Reported fever of 104F with Tylenol and cool bathes. Reported that since she was unable to get the fever down to 102F she came to the ED. Denied nausea, vomiting, diarrhea.   Results for orders placed during the hospital encounter of 10/02/13  CBC WITH DIFFERENTIAL      Result Value Ref Range   WBC 15.6 (*) 4.0 - 10.5 K/uL   RBC 4.27  3.87 - 5.11 MIL/uL   Hemoglobin 13.7  12.0 - 15.0 g/dL   HCT 38.6  36.0 - 46.0 %   MCV 90.4  78.0 - 100.0 fL   MCH 32.1  26.0 - 34.0 pg   MCHC 35.5  30.0 - 36.0 g/dL   RDW 15.8 (*) 11.5 - 15.5 %   Platelets 83 (*) 150 - 400 K/uL   Neutrophils Relative % 86 (*) 43 - 77 %   Neutro Abs 13.3 (*) 1.7 - 7.7 K/uL   Lymphocytes Relative 7 (*) 12 - 46 %   Lymphs Abs 1.1  0.7 - 4.0 K/uL   Monocytes Relative 8  3 - 12 %   Monocytes Absolute 1.2 (*) 0.1 - 1.0 K/uL   Eosinophils Relative 0  0 - 5 %   Eosinophils Absolute 0.0  0.0 - 0.7 K/uL   Basophils Relative 0  0 - 1 %   Basophils Absolute 0.0  0.0 - 0.1 K/uL  COMPREHENSIVE  METABOLIC PANEL      Result Value Ref Range   Sodium 133 (*) 137 - 147 mEq/L   Potassium 5.8 (*) 3.7 - 5.3 mEq/L   Chloride 100  96 - 112 mEq/L   CO2 18 (*) 19 - 32 mEq/L   Glucose, Bld 108 (*) 70 - 99 mg/dL   BUN 7  6 - 23 mg/dL   Creatinine, Ser 0.63  0.50 - 1.10 mg/dL   Calcium 9.1  8.4 - 10.5 mg/dL   Total Protein 8.2  6.0 - 8.3 g/dL   Albumin 3.2 (*) 3.5 - 5.2 g/dL   AST 132 (*) 0 - 37 U/L   ALT 63 (*) 0 - 35 U/L   Alkaline Phosphatase 469 (*) 39 - 117 U/L   Total Bilirubin 3.5 (*) 0.3 - 1.2 mg/dL   GFR calc non Af Amer >90  >90 mL/min   GFR calc Af Amer >90  >90 mL/min  LIPASE, BLOOD      Result Value Ref Range   Lipase 31  11 - 59 U/L  URINALYSIS, ROUTINE W REFLEX  MICROSCOPIC      Result Value Ref Range   Color, Urine ORANGE (*) YELLOW   APPearance CLOUDY (*) CLEAR   Specific Gravity, Urine 1.025  1.005 - 1.030   pH 5.5  5.0 - 8.0   Glucose, UA NEGATIVE  NEGATIVE mg/dL   Hgb urine dipstick NEGATIVE  NEGATIVE   Bilirubin Urine SMALL (*) NEGATIVE   Ketones, ur NEGATIVE  NEGATIVE mg/dL   Protein, ur NEGATIVE  NEGATIVE mg/dL   Urobilinogen, UA 1.0  0.0 - 1.0 mg/dL   Nitrite POSITIVE (*) NEGATIVE   Leukocytes, UA SMALL (*) NEGATIVE  PREGNANCY, URINE      Result Value Ref Range   Preg Test, Ur NEGATIVE  NEGATIVE  URINE MICROSCOPIC-ADD ON      Result Value Ref Range   Squamous Epithelial / LPF FEW (*) RARE   WBC, UA 3-6  <3 WBC/hpf   RBC / HPF 0-2  <3 RBC/hpf   Bacteria, UA MANY (*) RARE   Casts HYALINE CASTS (*) NEGATIVE   Urine-Other MUCOUS PRESENT    I-STAT CG4 LACTIC ACID, ED      Result Value Ref Range   Lactic Acid, Venous 1.72  0.5 - 2.2 mmol/L  I-STAT CHEM 8, ED      Result Value Ref Range   Sodium 137  137 - 147 mEq/L   Potassium 4.3  3.7 - 5.3 mEq/L   Chloride 106  96 - 112 mEq/L   BUN 5 (*) 6 - 23 mg/dL   Creatinine, Ser 0.70  0.50 - 1.10 mg/dL   Glucose, Bld 103 (*) 70 - 99 mg/dL   Calcium, Ion 1.05 (*) 1.12 - 1.23 mmol/L   TCO2 22  0 - 100 mmol/L    Hemoglobin 13.3  12.0 - 15.0 g/dL   HCT 39.0  36.0 - 46.0 %   Elevated white blood cell count 15.6 identified. CMP noted mildly low sodium of 133. Hyperkalemia of 5.8-moderate amylase identified, will recheck. Elevated AST of 132, ALT 63. Elevated alkaline phosphatase of 469. Bilirubin elevated at 3.5. Lipase negative elevation. Lactic acid negative elevation. Urine pregnancy negative. Urinalysis positive for nitrites and small leukocytes with many bacteria noted, white blood cells of 3-6. Urine culture pending, blood cultures pending. Chest x-ray negative for acute cardiopulmonary disease. CT abdomen and pelvis noted mild colonic mural thickening is probably secondary to nondistention cirrhosis-colitis could produce a similar presentation. Hepatic cirrhosis, splenomegaly and portal venous hypertension identified. Cholecystectomy noted. 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  8:39 AM This provider spoke with Dr. Dyann Kief, [facility] - discussed, case, history, presentation, labs, and imaging in great detail. Patient to be admitted to Team 8, regarding sepsis mainly from UTI, bilirubinemia, and elevated alk phos. As per admitting physician recommended patient to be admitted to [facility].   Repeat chem 8 performed after IV fluid hydration-potassium decreased to 5.8 to 4.3 - suspicion of elevated potassium to be due to hemolysis. EKG noted sinus tachycardia with a heart rate of 105 beats per minute with negative T wave abnormalities noted.  Medications  0.9 %  sodium chloride infusion (0 mLs Intravenous Stopped 10/02/13 0639)    Followed by  0.9 %  sodium chloride infusion (1,000 mLs Intravenous New Bag/Given 10/02/13 0639)    Followed by  0.9 %  sodium chloride infusion (1,000 mLs Intravenous New Bag/Given 10/02/13 0915)  piperacillin-tazobactam (ZOSYN) IVPB 3.375 g (not  administered)   acetaminophen (TYLENOL) tablet 650 mg (650 mg Oral Given 10/02/13 0534)  ondansetron (ZOFRAN) injection 4 mg (4 mg Intravenous Given 10/02/13 0534)  morphine 4 MG/ML injection 4 mg (4 mg Intravenous Given 10/02/13 0534)  iohexol (OMNIPAQUE) 300 MG/ML solution 50 mL (50 mLs Oral Contrast Given 10/02/13 0533)  Ampicillin-Sulbactam (UNASYN) 3 g in sodium chloride 0.9 % 100 mL IVPB (3 g Intravenous New Bag/Given 10/02/13 0702)  iohexol (OMNIPAQUE) 300 MG/ML solution 100 mL (100 mLs Intravenous Contrast Given 10/02/13 0653)  morphine 4 MG/ML injection 4 mg (4 mg Intravenous Given 10/02/13 0907)  ondansetron (ZOFRAN) injection 4 mg (4 mg Intravenous Given 10/02/13 0907)  sodium chloride 0.9 % bolus 1,000 mL (1,000 mLs Intravenous New Bag/Given 10/02/13 0907)   Filed Vitals:   10/02/13 0454 10/02/13 0643 10/02/13 0742  BP: 125/73 134/78 131/63  Pulse: 131 114 115  Temp: 101.3 F (38.5 C) 100.2 F (37.9 C)   TempSrc: Oral Oral   Resp: _0 SpO2: 99% 98% 96%   Diagnoses that have been ruled out:  None  Diagnoses that are still under consideration:  None  Final diagnoses:  Abdominal pain  SIRS (systemic inflammatory response syndrome)  UTI (urinary tract infection)  Bilirubinemia    Fever mildly controlled in ED setting has been reduced from 1.24F to 100.43F after Tylenol and administered. IV fluids administered with heart rate decreasing from 131 beats per minute 08/06/2003 beats per minute. Blood pressure controlled. Negative hypoxia identified. Patient to be admitted to the hospital regarding urinary tract infection, bilirubinemia and sepsis. Patient is been started on IV fluids and antibiotics in ED setting. Patient admitted to inpatient MedSurg floor under the care of Dr. Dyann Kief. Discussed plan of admission with patient. Patient understood and agreed to plan. Patient stable for transfer.   Jamse Mead, PA-C 10/02/13 [place], PA-C 10/02/13 1755

## 2013-10-02 NOTE — ED Provider Notes (Signed)
CSN: [id number]     Arrival date & time 10/02/13  0426 History   First MD Initiated Contact with Patient 10/02/13 [phone]     Chief Complaint  Patient presents with  . Fever  . Headache  . Abdominal Pain    (Consider location/radiation/quality/duration/timing/severity/associated sxs/prior Treatment) HPI Comments: Patient is a 38 year old female with a history of biliary atresia, enlarged liver, and anemia with an unknown surgical history who presents to the emergency department for fever and abdominal pain. Patient states the abdominal pain began yesterday. She states that pain is present primarily in her epigastric region and in her right lower abdomen. She states that pain in her right lower abdomen intermittently radiates up her right side to her right breast. Patient denies any modifying factors of her symptoms. She states that she woke up yesterday afternoon with a fever. Patient states she took her temperature orally at which time she found it to be 104.35F. Patient states she took a cool bath which improved her fever. She also took some Tylenol with temporary relief. She endorses mild SOB associated with her pain. Patient denies associated nausea, vomiting, diarrhea, melena or hematochezia, dysuria or hematuria, vaginal bleeding, vaginal discharge, dyspareunia, and numbness/tingling. She denies sick contacts and recent travel.  Patient is a 38 y.o. female presenting with fever, headaches, and abdominal pain. The history is provided by the patient. No language interpreter was used.  Fever Associated symptoms: chills and headaches   Associated symptoms: no diarrhea, no dysuria, no nausea and no vomiting   Headache Associated symptoms: abdominal pain, fatigue and fever   Associated symptoms: no diarrhea, no nausea and no vomiting   Abdominal Pain Associated symptoms: chills, fatigue, fever and shortness of breath   Associated symptoms: no diarrhea, no dysuria, no hematuria, no nausea, no  vaginal bleeding, no vaginal discharge and no vomiting     Past Medical History  Diagnosis Date  . Enlarged liver   . Anemia    Past Surgical History  Procedure Laterality Date  . Liver surgery    . Ovarian cyst removal    . Knee sugery      rod from knee to ankle,   . Hand surgery      rod from hand to elbow.   Family History  Problem Relation Age of Onset  . Hypertension Mother   . Hypertension Sister   . Cancer Maternal Grandmother    History  Substance Use Topics  . Smoking status: Current Every Day Smoker -- 0.25 packs/day for 10 years    Types: Cigarettes  . Smokeless tobacco: Not on file  . Alcohol Use: No     Comment: former   OB History   Grav Para Term Preterm Abortions TAB SAB Ect Mult Living   0              Review of Systems  Constitutional: Positive for fever, chills and fatigue.  Respiratory: Positive for shortness of breath.   Gastrointestinal: Positive for abdominal pain. Negative for nausea, vomiting, diarrhea and blood in stool.  Genitourinary: Negative for dysuria, hematuria, vaginal bleeding, vaginal discharge and vaginal pain.  Neurological: Positive for headaches.  All other systems reviewed and are negative.     Allergies  Review of patient's allergies indicates no known allergies.  Home Medications   Current Outpatient Rx  Name  Route  Sig  Dispense  Refill  . acetaminophen (TYLENOL) 500 MG tablet   Oral   Take 1,000 mg by mouth every 6 (  six) hours as needed (pain).         Marland Kitchen omeprazole (PRILOSEC OTC) 20 MG tablet   Oral   Take 20 mg by mouth daily.         . ranitidine (ZANTAC) 150 MG capsule   Oral   Take 150 mg by mouth 2 (two) times daily.          BP 125/73  Pulse 131  Temp(Src) 101.3 F (38.5 C) (Oral)  Resp 18  SpO2 99%  Physical Exam  Nursing note and vitals reviewed. Constitutional: She is oriented to person, place, and time. She appears well-developed and well-nourished. No distress.  Patient  nonseptic appearing. She is in no acute distress.  HENT:  Head: Normocephalic and atraumatic.  Mouth/Throat: Oropharynx is clear and moist. No oropharyngeal exudate.  Eyes: Conjunctivae and EOM are normal. Pupils are equal, round, and reactive to light. No scleral icterus.  Neck: Normal range of motion.  Cardiovascular: Regular rhythm, normal heart sounds and intact distal pulses.  Tachycardia present.   Pulmonary/Chest: Effort normal and breath sounds normal. No respiratory distress. She has no wheezes. She has no rales.  Abdominal: Soft. She exhibits no mass. There is tenderness (epigastric and right lower quadrant) in the right lower quadrant and epigastric area. There is tenderness at McBurney's point. There is no rebound, no guarding and negative Murphy's sign.  No peritoneal signs. Patient with multiple well-healed laparoscopic surgical scars to the abdomen.  Musculoskeletal: Normal range of motion.  Neurological: She is alert and oriented to person, place, and time.  GCS 15. Speech will oriented. Patient moves extremities without ataxia.  Skin: Skin is warm. No rash noted. She is diaphoretic (Mild). No erythema. No pallor.  Psychiatric: She has a normal mood and affect. Her behavior is normal.    ED Course  Procedures (including critical care time) Labs Review Labs Reviewed  CBC WITH DIFFERENTIAL - Abnormal; Notable for the following:    WBC 15.6 (*)    RDW 15.8 (*)    Neutrophils Relative % 86 (*)    Neutro Abs 13.3 (*)    Lymphocytes Relative 7 (*)    Monocytes Absolute 1.2 (*)    All other components within normal limits  COMPREHENSIVE METABOLIC PANEL - Abnormal; Notable for the following:    Sodium 133 (*)    Potassium 5.8 (*)    CO2 18 (*)    Glucose, Bld 108 (*)    Albumin 3.2 (*)    AST 132 (*)    ALT 63 (*)    Alkaline Phosphatase 469 (*)    Total Bilirubin 3.5 (*)    All other components within normal limits  CULTURE, BLOOD (ROUTINE X 2)  CULTURE, BLOOD  (ROUTINE X 2)  URINE CULTURE  LIPASE, BLOOD  URINALYSIS, ROUTINE W REFLEX MICROSCOPIC  PREGNANCY, URINE  I-STAT CG4 LACTIC ACID, ED   Imaging Review No results found.   EKG Interpretation None      MDM   Final diagnoses:  Abdominal pain  SIRS (systemic inflammatory response syndrome)    38 year old female presents for abdominal pain and fever with onset yesterday. Fever no as of this morning. No N/V/D. Patient nontoxic and nonseptic appearing. Abdominal exam with tenderness in the epigastric region and right lower quadrant. Right lower quadrant tenderness appreciated to be at McBurney's point. Patient poor historian with respect to surgical history despite her multiple well-healed laparoscopic abdominal surgical scars. Patient afebrile on arrival to 101.52F. Tachycardia on arrival to 131.  Blood pressure stable. Sepsis workup ordered given history and vital signs.  Patient with leukocytosis today at 15.6. She has a normal lactate. BMP ordered as labs significant for hyperkalemia, likely the result of homolysis. Patient also with elevated LFTs, alkaline phosphatase, and total bilirubin. Labs at this time, including epigastric tenderness, point to cholecystitis. Limited ultrasound of right upper quadrant pending as well as CT abdomen and pelvis. Unable to exclude appendicitis given leukocytosis and tenderness to palpation at McBurney's point without CT imaging. Patient started on 3 g Unasyn.  [facility] PA-C at shift change with labs and imaging pending. Anticipate admission today, potentially to surgical service if imaging consistent with this.  Filed Vitals:   10/02/13 0454  BP: 125/73  Pulse: 131  Temp: 101.3 F (38.5 C)  TempSrc: Oral  Resp: 18  SpO2: 99%       Antony MaduraKelly Ignacia Gentzler, PA-C 10/02/13 [phone]

## 2013-10-02 NOTE — ED Provider Notes (Signed)
Medical screening examination/treatment/procedure(s) were conducted as a shared visit with non-physician practitioner(s) or resident and myself. I personally evaluated the patient during the encounter and agree with the findings and plan unless otherwise indicated.  I have personally reviewed any xrays and/ or EKG's with the provider and I agree with interpretation.  Patient with history of biliary atresia, anemia presented for fever and abdominal pain for the past 48 hours. No history of similar. Pain is worse in the right upper quadrant that radiates to the right lower quadrant into the back. On exam patient tender right upper quadrant, no guarding or rigidity, scars noted from surgery at young age. Dry mucous membranes, skin warm to the touch, cranial nerves grossly intact, supple neck, nontoxic appearing. Concern for pyelonephritis or appendicitis or colitis. With sepsis criteria antibiotics ordered and plan for admission pending CT/ admission. Pending ultrasound.  Labs Reviewed   CBC WITH DIFFERENTIAL - Abnormal; Notable for the following:    WBC  15.6 (*)     RDW  15.8 (*)     Neutrophils Relative %  86 (*)     Neutro Abs  13.3 (*)     Lymphocytes Relative  7 (*)     Monocytes Absolute  1.2 (*)     All other components within normal limits   COMPREHENSIVE METABOLIC PANEL - Abnormal; Notable for the following:    Sodium  133 (*)     Potassium  5.8 (*)     CO2  18 (*)     Glucose, Bld  108 (*)     Albumin  3.2 (*)     AST  132 (*)     ALT  63 (*)     Alkaline Phosphatase  469 (*)     Total Bilirubin  3.5 (*)     All other components within normal limits   CULTURE, BLOOD (ROUTINE X 2)   CULTURE, BLOOD (ROUTINE X 2)   URINE CULTURE   LIPASE, BLOOD   URINALYSIS, ROUTINE W REFLEX MICROSCOPIC   PREGNANCY, URINE   I-STAT CG4 LACTIC ACID, ED   Dg Chest 2 View  10/02/2013   CLINICAL DATA:  Shortness of breath, fever.  EXAM: CHEST  2 VIEW  COMPARISON:  None available for comparison at time  of study interpretation.  FINDINGS: Cardiomediastinal silhouette is unremarkable. The lungs are clear without pleural effusions or focal consolidations. Trachea projects midline and there is no pneumothorax. Soft tissue planes and included osseous structures are non-suspicious.  IMPRESSION: No acute cardiopulmonary process.   Electronically Signed   By: Awilda Metro   On: 10/02/2013 06:57   Ct Abdomen Pelvis W Contrast  10/02/2013   CLINICAL DATA:  Increasing right lower abdominal pain with fever. Elevated white blood cell count.  EXAM: CT ABDOMEN AND PELVIS WITH CONTRAST  TECHNIQUE: Multidetector CT imaging of the abdomen and pelvis was performed using the standard protocol following bolus administration of intravenous contrast.  CONTRAST:  OMNIPAQUE IOHEXOL 300 MG/ML  SOLN  COMPARISON:  US TRANSVAGINAL NON-OB dated 06/27/2011  FINDINGS: Lung Bases: Clear.  Liver: Nodular contour of the liver consistent with hepatic cirrhosis. No focal mass lesion. Hypoplastic left hepatic lobe. Question partial hepatectomy.  Spleen:  Splenomegaly with 15 cm splenic span.  Gallbladder: Surgically absent compatible with prior cholecystectomy. Scarring in the right abdominal wall suggest prior open cholecystectomy.  Common bile duct: There is no dilation of the common bile duct. In the porta hepatis, 2 tiny loculated gas are present,  probably representing pneumobilia associated with sphincterotomy (image 20 series 2). No intra-abdominal free air.  Pancreas:  Normal.  Adrenal glands:  Normal bilaterally.  Kidneys: Normal enhancement. Both ureters appear within normal limits. Negative for calculi.  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 There are no inflammatory changes of stomach.  Small bowel: Duodenum appears normal. Small bowel is within normal limits without a obstruction or inflammatory changes. No mesenteric adenopathy.  Colon: Normal appendix. Tiny  appendicolith is present at the tip of the appendix. Colonic diverticulosis without diverticulitis. The sigmoid colon is decompressed. Mild wall thickening of the sigmoid is probably due to in underdistention rather than colitis. Mild bowel wall thickening can also be associated with cirrhosis.  Pelvic Genitourinary: Urinary bladder appears normal, partially collapsed. Uterus and adnexa have a normal physiologic appearance.  Bones: No aggressive osseous lesions. 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  Vasculature: Minimal atherosclerosis.  Portal venous hypertension.  Body Wall: Scarring in the right abdominal wall. Mild edema in the posterior subcutaneous fat.  IMPRESSION: 1. Mild colonic mural thickening is probably secondary to nondistention and cirrhosis. Colitis could produce a similar appearance. 2. Hepatic cirrhosis, splenomegaly and portal venous hypertension. 3. Cholecystectomy. 4. Tiny amount of gas in the porta hepatis is probably within the common bile duct and represents pneumobilia, likely associated with prior surgery for biliary atresia. There is no intra-abdominal free air to suggest perforated viscus.   Electronically Signed   By: Andreas NewportGeoffrey  Lamke M.D.   On: 10/02/2013 07:12    Right abdominal pain, Sepsis, hyperbilirubinemia, LFT elevation   Enid SkeensJoshua M Ugo Thoma, MD 10/02/13 [phone]

## 2013-10-02 NOTE — ED Notes (Signed)
Pt states her symptoms started yesterday but got worse today  Pt states earlier she had a temp of 104.1 so she took a cool bath and it came down some  Pt is also c/o headache  Pt is also c/o abd pain on her right lower abdomen that she states shoots up to her breast  Pt denies nausea or vomiting  Pt states she was light headed earlier from the fever

## 2013-10-03 LAB — COMPREHENSIVE METABOLIC PANEL
ALT: 49 U/L — AB (ref 0–35)
AST: 75 U/L — ABNORMAL HIGH (ref 0–37)
Albumin: 2.5 g/dL — ABNORMAL LOW (ref 3.5–5.2)
Alkaline Phosphatase: 353 U/L — ABNORMAL HIGH (ref 39–117)
BUN: 5 mg/dL — AB (ref 6–23)
CALCIUM: 8.9 mg/dL (ref 8.4–10.5)
CO2: 22 mEq/L (ref 19–32)
Chloride: 104 mEq/L (ref 96–112)
Creatinine, Ser: 0.66 mg/dL (ref 0.50–1.10)
GFR calc non Af Amer: >90 mL/min (ref >90)
Glucose, Bld: 98 mg/dL (ref 70–99)
POTASSIUM: 3.7 meq/L (ref 3.7–5.3)
SODIUM: 136 meq/L — AB (ref 137–147)
TOTAL PROTEIN: 6.5 g/dL (ref 6.0–8.3)
Total Bilirubin: 5.3 mg/dL — ABNORMAL HIGH (ref 0.3–1.2)

## 2013-10-03 LAB — CBC
HEMATOCRIT: 32.9 % — AB (ref 36.0–46.0)
HEMOGLOBIN: 11.3 g/dL — AB (ref 12.0–15.0)
MCH: 31.5 pg (ref 26.0–34.0)
MCHC: 34.3 g/dL (ref 30.0–36.0)
MCV: 91.6 fL (ref 78.0–100.0)
Platelets: 41 10*3/uL — ABNORMAL LOW (ref 150–400)
RBC: 3.59 MIL/uL — ABNORMAL LOW (ref 3.87–5.11)
RDW: 16 % — AB (ref 11.5–15.5)
WBC: 7 10*3/uL (ref 4.0–10.5)

## 2013-10-03 LAB — PROTIME-INR
INR: 1.44 (ref 0.00–1.49)
Prothrombin Time: 17.2 seconds — ABNORMAL HIGH (ref 11.6–15.2)

## 2013-10-03 MED ORDER — SODIUM CHLORIDE 0.9 % IV SOLN
INTRAVENOUS | Status: DC
  Administered 2013-10-04: 03:00:00 via INTRAVENOUS

## 2013-10-03 NOTE — Progress Notes (Signed)
Clinical Social Work Department BRIEF PSYCHOSOCIAL ASSESSMENT 10/03/2013  Patient:  Dominique Christian, Dominique Christian     Account Number:  [id number]     Admit date:  10/02/2013  Clinical Social Worker:  Earlie Server  Date/Time:  10/03/2013 10:30 AM  Referred by:  RN  Date Referred:  10/03/2013 Referred for  Psychosocial assessment   Other Referral:   Interview type:  Patient Other interview type:    PSYCHOSOCIAL DATA Living Status:  FRIEND(S) Admitted from facility:   Level of care:   Primary support name:  Hassan Rowan Primary support relationship to patient:  PARENT Degree of support available:   Adequate    CURRENT CONCERNS Current Concerns  Other - See comment   Other Concerns:   Patient had questions re: insurance    SOCIAL WORK ASSESSMENT / PLAN CSW received referral during progression meeting and RN reports that patient wanted to speak with CSW. CSW reviewed chart and met with patient at bedside. CSW introduced myself and explained role.    Patient laying in bed and agreeable to assessment. Patient reports that she wanted to talk with CSW because the hospital thought she did not have any insurance but she is insured. Patient reports that she did not have her card at admission but has her policy # and group #. Patient reports she just got off the phone with admitting department who is agreeable to enter this information in the system.    CSW inquired about patient's assistance once she leaves the hospital. Patient reports that she lives with her boyfriend who is a Administrator. Patient reports that she has already spoken to her mom about staying with her after her DC so that she can assist her as needed. Patient reports she is doing well with attending MD appointments and compliant with medications. Patient reports no further concerns but aware that she can contact CSW if needed.   Assessment/plan status:  No Further Intervention Required Other assessment/ plan:   Information/referral to  community resources:   Insurance questions-referred to admitting office    PATIENT'S/FAMILY'S RESPONSE TO PLAN OF CARE: Patient alert and oriented. Patient reports that she is feeling bad due to migraine but feels she has received great care. Patient reports she was stressed out about thinking that her insurance would not cover hospital stay but feels relieved now that her insurance has been entered into the system. Patient reports that she has great supports and will ensure she is getting additional assistance at DC if needed. Patient engaged during assessment and thanked CSW for time but reports no needs.       [place], [place] [phone]

## 2013-10-03 NOTE — Progress Notes (Signed)
Notified by lab of blood culture result. MD on call notified.

## 2013-10-03 NOTE — Progress Notes (Signed)
Progress Note   Subjective  no abdominal pain. Feels okay today   Objective   Vital signs in last 24 hours: Temp:  [99.5 F (37.5 C)-101.4 F (38.6 C)] 99.5 F (37.5 C) (04/01 0539) Pulse Rate:  [94-110] 94 (04/01 0539) Resp:  [16-18] 18 (04/01 0539) BP: (109-145)/(69-87) 109/69 mmHg (04/01 0539) SpO2:  [94 %-97 %] 95 % (04/01 0539) Weight:  [216 lb 0.8 oz (98 kg)] 216 lb 0.8 oz (98 kg) (03/31 1107) Last BM Date: 10/03/13 General:    Pleasant black female in NAD Heart:  Regular rate and rhythm Abdomen:  Soft, nontender and nondistended. Normal bowel sounds. Extremities:  Without edema. Neurologic:  Alert and oriented,  grossly normal neurologically. Psych:  Cooperative. Normal mood and affect.  Lab Results:  Recent Labs  10/02/13 0533 10/02/13 0924 10/03/13 0653  WBC 15.6*  --  7.0  HGB 13.7 13.3 11.3*  HCT 38.6 39.0 32.9*  PLT 83*  --  41*   BMET  Recent Labs  10/02/13 0533 10/02/13 0924 10/03/13 0653  NA 133* 137 136*  K 5.8* 4.3 3.7  CL 100 106 104  CO2 18*  --  22  GLUCOSE 108* 103* 98  BUN 7 5* 5*  CREATININE 0.63 0.70 0.66  CALCIUM 9.1  --  8.9   LFT  Recent Labs  10/03/13 0653  PROT 6.5  ALBUMIN 2.5*  AST 75*  ALT 49*  ALKPHOS 353*  BILITOT 5.3*   PT/INR  Recent Labs  10/03/13 0755  LABPROT 17.2*  INR 1.44    Studies/Results: Dg Chest 2 View  10/02/2013   CLINICAL DATA:  Shortness of breath, fever.  EXAM: CHEST  2 VIEW  COMPARISON:  None available for comparison at time of study interpretation.  FINDINGS: Cardiomediastinal silhouette is unremarkable. The lungs are clear without pleural effusions or focal consolidations. Trachea projects midline and there is no pneumothorax. Soft tissue planes and included osseous structures are non-suspicious.  IMPRESSION: No acute cardiopulmonary process.   Electronically Signed   By: Elon Alas   On: 10/02/2013 06:57   Ct Abdomen Pelvis W Contrast  10/02/2013   CLINICAL DATA:   Increasing right lower abdominal pain with fever. Elevated white blood cell count.  EXAM: CT ABDOMEN AND PELVIS WITH CONTRAST  TECHNIQUE: Multidetector CT imaging of the abdomen and pelvis was performed using the standard protocol following bolus administration of intravenous contrast.  CONTRAST:  120mL OMNIPAQUE IOHEXOL 300 MG/ML  SOLN  COMPARISON:  US TRANSVAGINAL NON-OB dated 06/27/2011  FINDINGS: Lung Bases: Clear.  Liver: Nodular contour of the liver consistent with hepatic cirrhosis. No focal mass lesion. Hypoplastic left hepatic lobe. Question partial hepatectomy.  Spleen:  Splenomegaly with 15 cm splenic span.  Gallbladder: Surgically absent compatible with prior cholecystectomy. Scarring in the right abdominal wall suggest prior open cholecystectomy.  Common bile duct: There is no dilation of the common bile duct. In the porta hepatis, 2 tiny loculated gas are present, probably representing pneumobilia associated with sphincterotomy (image 20 series 2). No intra-abdominal free air.  Pancreas:  Normal.  Adrenal glands:  Normal bilaterally.  Kidneys: Normal enhancement. Both ureters appear within normal limits. Negative for calculi.  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 There are no inflammatory changes of stomach.  Small bowel: Duodenum appears normal. Small bowel is within normal limits without a obstruction or inflammatory changes. No mesenteric adenopathy.  Colon: Normal appendix. Tiny appendicolith is present at the tip  of the appendix. Colonic diverticulosis without diverticulitis. The sigmoid colon is decompressed. Mild wall thickening of the sigmoid is probably due to in underdistention rather than colitis. Mild bowel wall thickening can also be associated with cirrhosis.  Pelvic Genitourinary: Urinary bladder appears normal, partially collapsed. Uterus and adnexa have a normal physiologic appearance.  Bones: No aggressive osseous lesions. SI  joint degenerative disease with partial ankylosis.  Vasculature: Minimal atherosclerosis.  Portal venous hypertension.  Body Wall: Scarring in the right abdominal wall. Mild edema in the posterior subcutaneous fat.  IMPRESSION: 1. Mild colonic mural thickening is probably secondary to nondistention and cirrhosis. Colitis could produce a similar appearance. 2. Hepatic cirrhosis, splenomegaly and portal venous hypertension. 3. Cholecystectomy. 4. Tiny amount of gas in the porta hepatis is probably within the common bile duct and represents pneumobilia, likely associated with prior surgery for biliary atresia. There is no intra-abdominal free air to suggest perforated viscus.   Electronically Signed   By: Dereck Ligas M.D.   On: 10/02/2013 07:12      Assessment / Plan:   28. 38 year old female with cirrhosis complicated by portal HTN . 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 INR 1.44.    2. Fever /abdominal pain /  Elevated LFTs. Blood cultures positive for GNR, Possibly cholangitis but also may be secondary to e.coli UTI. On Zosyn, perhaps spectrum could be narrowed? Awaiting MRCP results.     LOS: 1 day   Dominique Christian  10/03/2013, 9:08 AM   ________________________________________________________________________  Velora Heckler GI MD note:  I personally examined the patient, reviewed the data and agree with the assessment and plan described above.  She feels 100% better today: abd pain gone now. Tolerated regular diet.  LFTs about the same (alk phos lower, T bili higher).  Acute hepatitis panel all negative.  GNRods in blood and >100K Ecoli in urine.  The uti may be source of her acute clinical illness, causing bacteremia.  Cannot exclude biliary source but with her anatomy intervention (ERCP) would be techinically very difficult even if we knew the had cholangitis. Reasonable to observe her LFTS for now, continue abx necessary to treat UTI.   Awaiting MRI reading. IN bigger picture, she has cirrhosis now (as expected from her surgery as infant) and will need outpatient workup, referral to liver transplant center.     Owens Loffler, MD [facility] Pager [phone]

## 2013-10-03 NOTE — Progress Notes (Signed)
[facility] PROGRESS NOTE  Marland KitchenJeanne D Christian YNW:[id number] DOB: 1976/01/19 DOA: 10/02/2013 PCP: No primary provider on file.  Assessment/Plan: 38 y.o. 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 Sepsis/sirs/bacteremia/uti' blood cultures: GNR, pend sensitivity; Possibly cholangitis pend MRCP -cont IV atx, pend sensitivity, IVF; close monitor    2. Hepatic cirrhosis, splenomegaly and portal venous hypertension, coagulopathy  -s/p surgery and gallbladder removal when she was 1-2 months old -appreciate GI input; awaiting MRCP   3. tobacco abuse: cessation counseling provided. Patient declines nicotine patch   5. GERD (gastroesophageal reflux disease): continue PPI (will adjust to BID)     Code Status: full Family Communication: d/w patient, her mother, sister  (indicate person spoken with, relationship, and if by phone, the number) Disposition Plan: home pend clinical improvement    Consultants:  GI, ID  Procedures:  nonen  Antibiotics:  Zosyn 3/31<<<<   (indicate start date, and stop date if known)  HPI/Subjective: Alert   Objective: Filed Vitals:   10/03/13 0539  BP: 109/69  Pulse: 94  Temp: 99.5 F (37.5 C)  Resp: 18   No intake or output data in the 24 hours ending 10/03/13 1250 Filed Weights   10/02/13 1107  Weight: 98 kg (216 lb 0.8 oz)    Exam:   General:  alert  Cardiovascular: s1,s2  Respiratory: CTA BL  Abdomen: soft, nt,nd   Musculoskeletal: no LE edema   Data Reviewed: Basic Metabolic Panel:  Recent Labs Lab 10/02/13 0533 10/02/13 0924 10/02/13 1534 10/03/13 0653  NA 133* 137  --  136*  K 5.8* 4.3  --  3.7  CL 100 106  --  104  CO2 18*  --   --  22  GLUCOSE 108* 103*  --  98  BUN 7 5*  --  5*  CREATININE 0.63 0.70  --  0.66  CALCIUM  9.1  --   --  8.9  MG  --   --  1.6  --    Liver Function Tests:  Recent Labs Lab 10/02/13 0533 10/03/13 0653  AST 132* 75*  ALT 63* 49*  ALKPHOS 469* 353*  BILITOT 3.5* 5.3*  PROT 8.2 6.5  ALBUMIN 3.2* 2.5*    Recent Labs Lab 10/02/13 0533  LIPASE 31   No results found for this basename: AMMONIA,  in the last 168 hours CBC:  Recent Labs Lab 10/02/13 0533 10/02/13 0924 10/03/13 0653  WBC 15.6*  --  7.0  NEUTROABS 13.3*  --   --   HGB 13.7 13.3 11.3*  HCT 38.6 39.0 32.9*  MCV 90.4  --  91.6  PLT 83*  --  41*   Cardiac Enzymes: No results found for this basename: CKTOTAL, CKMB, CKMBINDEX, TROPONINI,  in the last 168 hours BNP (last 3 results) No results found for this basename: PROBNP,  in the last 8760 hours CBG: No results found for this basename: GLUCAP,  in the last 168 hours  Recent Results (from the past 240 hour(s))  CULTURE, BLOOD (ROUTINE X 2)     Status: None   Collection Time    10/02/13  5:14 AM      Result Value Ref Range Status   Specimen Description BLOOD LEFT ANTECUBITAL   Final   Special Requests BOTTLES DRAWN AEROBIC AND ANAEROBIC 5CC  Final   Culture  Setup Time     Final   Value: 10/02/2013 08:15     Performed at [facility]   Culture     Final   Value: GRAM NEGATIVE RODS     Note: Gram Stain Report Called to,Read Back By and Verified With: ASHTON MERRITT ON 10/02/2013 AT 11:59P BY WILEJ     Performed at [facility]   Report Status PENDING   Incomplete  CULTURE, BLOOD (ROUTINE X 2)     Status: None   Collection Time    10/02/13  5:21 AM      Result Value Ref Range Status   Specimen Description BLOOD RIGHT ANTECUBITAL   Final   Special Requests BOTTLES DRAWN AEROBIC AND ANAEROBIC 5CC   Final   Culture  Setup Time     Final   Value: 10/02/2013 08:15     Performed at [facility]   Culture     Final   Value: GRAM NEGATIVE RODS     Note: Gram Stain Report Called to,Read Back By and Verified With: ASHTON  MERRITT ON 10/02/2013 AT 11:59P BY WILEJ     Performed at [facility]   Report Status PENDING   Incomplete  URINE CULTURE     Status: None   Collection Time    10/02/13  6:17 AM      Result Value Ref Range Status   Specimen Description URINE, CLEAN CATCH   Final   Special Requests NONE   Final   Culture  Setup Time     Final   Value: 10/02/2013 08:17     Performed at [facility] Count     Final   Value: >=100,000 COLONIES/ML     Performed at [facility]   Culture     Final   Value: ESCHERICHIA COLI     Performed at [facility]   Report Status PENDING   Incomplete     Studies: Dg Chest 2 View  10/02/2013   CLINICAL DATA:  Shortness of breath, fever.  EXAM: CHEST  2 VIEW  COMPARISON:  None available for comparison at time of study interpretation.  FINDINGS: Cardiomediastinal silhouette is unremarkable. The lungs are clear without pleural effusions or focal consolidations. Trachea projects midline and there is no pneumothorax. Soft tissue planes and included osseous structures are non-suspicious.  IMPRESSION: No acute cardiopulmonary process.   Electronically Signed   By: Awilda Metro   On: 10/02/2013 06:57   Ct Abdomen Pelvis W Contrast  10/02/2013   CLINICAL DATA:  Increasing right lower abdominal pain with fever. Elevated white blood cell count.  EXAM: CT ABDOMEN AND PELVIS WITH CONTRAST  TECHNIQUE: Multidetector CT imaging of the abdomen and pelvis was performed using the standard protocol following bolus administration of intravenous contrast.  CONTRAST:  OMNIPAQUE IOHEXOL 300 MG/ML  SOLN  COMPARISON:  US TRANSVAGINAL NON-OB dated 06/27/2011  FINDINGS: Lung Bases: Clear.  Liver: Nodular contour of the liver consistent with hepatic cirrhosis. No focal mass lesion. Hypoplastic left hepatic lobe. Question partial hepatectomy.  Spleen:  Splenomegaly with 15 cm splenic span.  Gallbladder: Surgically absent compatible with prior  cholecystectomy. Scarring in the right abdominal wall suggest prior open cholecystectomy.  Common bile duct: There is no dilation of the common bile duct. In the porta hepatis, 2 tiny loculated gas are present, probably representing pneumobilia associated with sphincterotomy (image 20 series 2). No intra-abdominal free air.  Pancreas:  Normal.  Adrenal glands:  Normal bilaterally.  Kidneys: Normal enhancement. Both ureters appear within normal limits. Negative for calculi.  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 There are no inflammatory changes of stomach.  Small bowel: Duodenum appears normal. Small bowel is within normal limits without a obstruction or inflammatory changes. No mesenteric adenopathy.  Colon: Normal appendix. Tiny appendicolith is present at the tip of the appendix. Colonic diverticulosis without diverticulitis. The sigmoid colon is decompressed. Mild wall thickening of the sigmoid is probably due to in underdistention rather than colitis. Mild bowel wall thickening can also be associated with cirrhosis.  Pelvic Genitourinary: Urinary bladder appears normal, partially collapsed. Uterus and adnexa have a normal physiologic appearance.  Bones: No aggressive osseous lesions. 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  Vasculature: Minimal atherosclerosis.  Portal venous hypertension.  Body Wall: Scarring in the right abdominal wall. Mild edema in the posterior subcutaneous fat.  IMPRESSION: 1. Mild colonic mural thickening is probably secondary to nondistention and cirrhosis. Colitis could produce a similar appearance. 2. Hepatic cirrhosis, splenomegaly and portal venous hypertension. 3. Cholecystectomy. 4. Tiny amount of gas in the porta hepatis is probably within the common bile duct and represents pneumobilia, likely associated with prior surgery for biliary atresia. There is no intra-abdominal free air to suggest perforated  viscus.   Electronically Signed   By: Andreas Newport M.D.   On: 10/02/2013 07:12    Scheduled Meds: . docusate sodium  100 mg Oral BID  . pantoprazole  40 mg Oral BID WC  . piperacillin-tazobactam (ZOSYN)  IV  3.375 g Intravenous Q8H  . polyethylene glycol  17 g Oral Daily   Continuous Infusions:   Active Problems:   Sepsis   UTI (urinary tract infection)   Bilirubinemia   Abdominal pain   Tobacco abuse   GERD (gastroesophageal reflux disease)   Transaminitis    Time spent: >35 minutes     Esperanza Sheets  [facility] Pager [phone]. If 7PM-7AM, please contact night-coverage at [email or web address], password [facility] 10/03/2013, 12:50 PM  LOS: 1 day

## 2013-10-03 NOTE — ED Provider Notes (Signed)
Medical screening examination/treatment/procedure(s) were performed by non-physician practitioner and as supervising physician I was immediately available for consultation/collaboration.   Shanna CiscoMegan E Docherty, MD 10/03/13 [phone]

## 2013-10-04 ENCOUNTER — Telehealth: Payer: Self-pay

## 2013-10-04 LAB — URINE CULTURE: Colony Count: >=100000

## 2013-10-04 LAB — CULTURE, BLOOD (ROUTINE X 2)

## 2013-10-04 LAB — COMPREHENSIVE METABOLIC PANEL
ALK PHOS: 332 U/L — AB (ref 39–117)
ALT: 45 U/L — AB (ref 0–35)
AST: 68 U/L — AB (ref 0–37)
Albumin: 2.7 g/dL — ABNORMAL LOW (ref 3.5–5.2)
BILIRUBIN TOTAL: 4.3 mg/dL — AB (ref 0.3–1.2)
BUN: 6 mg/dL (ref 6–23)
CHLORIDE: 104 meq/L (ref 96–112)
CO2: 20 mEq/L (ref 19–32)
Calcium: 8.9 mg/dL (ref 8.4–10.5)
Creatinine, Ser: 0.6 mg/dL (ref 0.50–1.10)
GFR calc non Af Amer: >90 mL/min (ref >90)
Glucose, Bld: 85 mg/dL (ref 70–99)
Potassium: 3.6 mEq/L — ABNORMAL LOW (ref 3.7–5.3)
SODIUM: 136 meq/L — AB (ref 137–147)
Total Protein: 6.8 g/dL (ref 6.0–8.3)

## 2013-10-04 LAB — CBC
HCT: 33.7 % — ABNORMAL LOW (ref 36.0–46.0)
HEMOGLOBIN: 11.7 g/dL — AB (ref 12.0–15.0)
MCH: 31.4 pg (ref 26.0–34.0)
MCHC: 34.7 g/dL (ref 30.0–36.0)
MCV: 90.3 fL (ref 78.0–100.0)
PLATELETS: 46 10*3/uL — AB (ref 150–400)
RBC: 3.73 MIL/uL — ABNORMAL LOW (ref 3.87–5.11)
RDW: 16 % — AB (ref 11.5–15.5)
WBC: 4.7 10*3/uL (ref 4.0–10.5)

## 2013-10-04 MED ORDER — LEVOFLOXACIN IN D5W 750 MG/150ML IV SOLN
750.0000 mg | INTRAVENOUS | Status: DC
  Administered 2013-10-04: 750 mg via INTRAVENOUS
  Filled 2013-10-04 (×2): qty 150

## 2013-10-04 NOTE — Telephone Encounter (Signed)
Message copied by Chrystie NoseHUNT, LINDA R on Thu Oct 04, 2013  2:52 PM ------      Message from: Rob BuntingJACOBS, DANIEL P      Created: Thu Oct 04, 2013  2:34 PM       She needs rov with Dr. Juanda ChanceBrodie in 5-6  weeks for newly diagnosed cirrhosis during hosp stay for UTI/pyelonephritis.  Thanks       ------

## 2013-10-04 NOTE — Progress Notes (Addendum)
[facility] PROGRESS NOTE  Marland KitchenJeanne D Christian WJX:[id number] DOB: 17-Dec-1975 DOA: 10/02/2013 PCP: No primary provider on file.  Assessment/Plan: 38 y.o. 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 Sepsis/sirs/bacteremia/uti' blood cultures: +E coli;  -cont IV atx, IVF; d/w ID Dr. Orvan Falconerampbell, likely to convert to PO in AM and d/c to complete 14 day treatment;     2. Hepatic cirrhosis, splenomegaly and portal venous hypertension, coagulopathy; + etoh use  -s/p surgery and gallbladder removal when she was 1-2 months old -appreciate GI input; outpatient liver transplant referral per GI follow up and arrangements    3. tobacco abuse: cessation counseling provided. Patient declines nicotine patch   5. GERD (gastroesophageal reflux disease): continue PPI (will adjust to BID)     Code Status: full Family Communication: d/w patient, her mother, sister  (indicate person spoken with, relationship, and if by phone, the number) Disposition Plan: home pend clinical improvement    Consultants:  GI, ID  Procedures:  none  Antibiotics:  Zosyn 3/31<<<<4/2  Levofloxacin 4/2<<<   (indicate start date, and stop date if known)  HPI/Subjective: Alert   Objective: Filed Vitals:   10/04/13 0616  BP: 115/69  Pulse: 89  Temp: 98.4 F (36.9 C)  Resp: 18    Intake/Output Summary (Last 24 hours) at 10/04/13 1124 Last data filed at 10/04/13 1021  Gross per 24 hour  Intake 3272.19 ml  Output      0 ml  Net 3272.19 ml   Filed Weights   10/02/13 1107  Weight: 98 kg (216 lb 0.8 oz)    Exam:   General:  alert  Cardiovascular: s1,s2  Respiratory: CTA BL  Abdomen: soft, nt,nd   Musculoskeletal: no LE edema   Data Reviewed: Basic Metabolic Panel:  Recent Labs Lab 10/02/13 0533  10/02/13 0924 10/02/13 1534 10/03/13 0653 10/04/13 0500  NA 133* 137  --  136* 136*  K 5.8* 4.3  --  3.7 3.6*  CL 100 106  --  104 104  CO2 18*  --   --  22 20  GLUCOSE 108* 103*  --  98 85  BUN 7 5*  --  5* 6  CREATININE 0.63 0.70  --  0.66 0.60  CALCIUM 9.1  --   --  8.9 8.9  MG  --   --  1.6  --   --    Liver Function Tests:  Recent Labs Lab 10/02/13 0533 10/03/13 0653 10/04/13 0500  AST 132* 75* 68*  ALT 63* 49* 45*  ALKPHOS 469* 353* 332*  BILITOT 3.5* 5.3* 4.3*  PROT 8.2 6.5 6.8  ALBUMIN 3.2* 2.5* 2.7*    Recent Labs Lab 10/02/13 0533  LIPASE 31   No results found for this basename: AMMONIA,  in the last 168 hours CBC:  Recent Labs Lab 10/02/13 0533 10/02/13 0924 10/03/13 0653 10/04/13 0500  WBC 15.6*  --  7.0 4.7  NEUTROABS 13.3*  --   --   --   HGB 13.7 13.3 11.3* 11.7*  HCT 38.6 39.0 32.9* 33.7*  MCV 90.4  --  91.6 90.3  PLT 83*  --  41* 46*   Cardiac Enzymes: No results found for this basename: CKTOTAL, CKMB, CKMBINDEX, TROPONINI,  in the last 168 hours BNP (last 3 results) No results found for this basename:  PROBNP,  in the last 8760 hours CBG: No results found for this basename: GLUCAP,  in the last 168 hours  Recent Results (from the past 240 hour(s))  CULTURE, BLOOD (ROUTINE X 2)     Status: None   Collection Time    10/02/13  5:14 AM      Result Value Ref Range Status   Specimen Description BLOOD LEFT ANTECUBITAL   Final   Special Requests BOTTLES DRAWN AEROBIC AND ANAEROBIC 5CC   Final   Culture  Setup Time     Final   Value: 10/02/2013 08:15     Performed at [facility]   Culture     Final   Value: ESCHERICHIA COLI     Note: Gram Stain Report Called to,Read Back By and Verified With: ASHTON MERRITT ON 10/02/2013 AT 11:59P BY WILEJ     Performed at [facility]   Report Status 10/04/2013 FINAL   Final   Organism ID, Bacteria ESCHERICHIA COLI   Final  CULTURE, BLOOD (ROUTINE X 2)     Status: None   Collection  Time    10/02/13  5:21 AM      Result Value Ref Range Status   Specimen Description BLOOD RIGHT ANTECUBITAL   Final   Special Requests BOTTLES DRAWN AEROBIC AND ANAEROBIC 5CC   Final   Culture  Setup Time     Final   Value: 10/02/2013 08:15     Performed at [facility]   Culture     Final   Value: ESCHERICHIA COLI     Note: SUSCEPTIBILITIES PERFORMED ON PREVIOUS CULTURE WITHIN THE LAST 5 DAYS.     Note: Gram Stain Report Called to,Read Back By and Verified With: ASHTON MERRITT ON 10/02/2013 AT 11:59P BY WILEJ     Performed at [facility]   Report Status 10/04/2013 FINAL   Final  URINE CULTURE     Status: None   Collection Time    10/02/13  6:17 AM      Result Value Ref Range Status   Specimen Description URINE, CLEAN CATCH   Final   Special Requests NONE   Final   Culture  Setup Time     Final   Value: 10/02/2013 08:17     Performed at [facility] Count     Final   Value: >=100,000 COLONIES/ML     Performed at [facility]   Culture     Final   Value: ESCHERICHIA COLI     Performed at [facility]   Report Status 10/04/2013 FINAL   Final   Organism ID, Bacteria ESCHERICHIA COLI   Final     Studies: Mr 3d Recon At Scanner  October 23, 2013   CLINICAL DATA:  Elevated liver function tests. Abdominal pain, fever, and leukocytosis. Anemia. Sepsis.  EXAM: MRI ABDOMEN WITHOUT AND WITH CONTRAST (INCLUDING MRCP)  TECHNIQUE: Multiplanar multisequence MR imaging of the abdomen was performed both before and after the administration of intravenous contrast. Heavily T2-weighted images of the biliary and pancreatic ducts were obtained, and three-dimensional MRCP images were rendered by post processing.  CONTRAST:  20mL MULTIHANCE GADOBENATE DIMEGLUMINE 529 MG/ML IV SOLN  COMPARISON:  None.  FINDINGS: Advanced macronodular hepatic cirrhosis is demonstrated, as well as moderate splenomegaly and upper abdominal and esophageal varices, consistent with  portal venous hypertension. The main and right portal vein are patent. There is severe left hepatic lobe atrophy likely due to chronic left  portal vein thrombosis. No acute portal vein thrombus visualized.  The hepatic parenchyma shows heterogeneous pattern of central iron deposition, however no discrete hypervascular or T2 hyperintense hepatic masses or nodules are visualized. MRCP images are partially degraded by motion artifact, however there is no evidence of biliary or pancreatic ductal dilatation.  The pancreas and kidneys are normal in appearance. No evidence of adrenal mass. Mild lymphadenopathy noted in the porta hepatis, likely reactive in the setting of hepatic cirrhosis. No evidence of inflammatory process, ascites, or abscess. No evidence of dilated abdominal bowel loops.  IMPRESSION: Advanced macronodular hepatic cirrhosis. Diffuse left hepatic lobe atrophy noted, likely due to chronic left portal vein thrombosis.  No radiographic evidence of hepatic neoplasm or acute portal vein thrombosis.  Moderate splenomegaly and esophageal varices, consistent with portal venous hypertension. No evidence of ascites.  No evidence of biliary or pancreatic ductal dilatation.   Electronically Signed   By: Myles Rosenthal M.D.   On: 10/03/2013 13:38   Mr Abd W/wo Cm/mrcp  10/03/2013   CLINICAL DATA:  Elevated liver function tests. Abdominal pain, fever, and leukocytosis. Anemia. Sepsis.  EXAM: MRI ABDOMEN WITHOUT AND WITH CONTRAST (INCLUDING MRCP)  TECHNIQUE: Multiplanar multisequence MR imaging of the abdomen was performed both before and after the administration of intravenous contrast. Heavily T2-weighted images of the biliary and pancreatic ducts were obtained, and three-dimensional MRCP images were rendered by post processing.  CONTRAST:  20mL MULTIHANCE GADOBENATE DIMEGLUMINE 529 MG/ML IV SOLN  COMPARISON:  None.  FINDINGS: Advanced macronodular hepatic cirrhosis is demonstrated, as well as moderate splenomegaly  and upper abdominal and esophageal varices, consistent with portal venous hypertension. The main and right portal vein are patent. There is severe left hepatic lobe atrophy likely due to chronic left portal vein thrombosis. No acute portal vein thrombus visualized.  The hepatic parenchyma shows heterogeneous pattern of central iron deposition, however no discrete hypervascular or T2 hyperintense hepatic masses or nodules are visualized. MRCP images are partially degraded by motion artifact, however there is no evidence of biliary or pancreatic ductal dilatation.  The pancreas and kidneys are normal in appearance. No evidence of adrenal mass. Mild lymphadenopathy noted in the porta hepatis, likely reactive in the setting of hepatic cirrhosis. No evidence of inflammatory process, ascites, or abscess. No evidence of dilated abdominal bowel loops.  IMPRESSION: Advanced macronodular hepatic cirrhosis. Diffuse left hepatic lobe atrophy noted, likely due to chronic left portal vein thrombosis.  No radiographic evidence of hepatic neoplasm or acute portal vein thrombosis.  Moderate splenomegaly and esophageal varices, consistent with portal venous hypertension. No evidence of ascites.  No evidence of biliary or pancreatic ductal dilatation.   Electronically Signed   By: Myles Rosenthal M.D.   On: 10/03/2013 13:38    Scheduled Meds: . docusate sodium  100 mg Oral BID  . pantoprazole  40 mg Oral BID WC  . piperacillin-tazobactam (ZOSYN)  IV  3.375 g Intravenous Q8H  . polyethylene glycol  17 g Oral Daily   Continuous Infusions: . sodium chloride 125 mL/hr at 10/04/13 0255    Active Problems:   Sepsis   UTI (urinary tract infection)   Bilirubinemia   Abdominal pain   Tobacco abuse   GERD (gastroesophageal reflux disease)   Transaminitis    Time spent: >35 minutes     Esperanza Sheets  [facility] Pager [phone]. If 7PM-7AM, please contact night-coverage at [email or web address], password  [facility] 10/04/2013, 11:24 AM  LOS: 2 days

## 2013-10-04 NOTE — Telephone Encounter (Signed)
Pt scheduled to see Dr. Juanda ChanceBrodie 11/09/13@2 :15pm. Letter mailed to pt.

## 2013-10-04 NOTE — Progress Notes (Signed)
Progress Note   Subjective  feels well today except having some mild epigastric discomfort.  Having BMs, eating solids.    Objective   Vital signs in last 24 hours: Temp:  [98.4 F (36.9 C)-98.8 F (37.1 C)] 98.4 F (36.9 C) (04/02 0616) Pulse Rate:  [86-99] 89 (04/02 0616) Resp:  [18] 18 (04/02 0616) BP: (113-119)/(69-72) 115/69 mmHg (04/02 0616) SpO2:  [98 %-99 %] 98 % (04/02 0616) Last BM Date: 10/04/13 General:    Pleasant black female in NAD Heart:  Regular rate and rhythm Abdomen:  Soft, nontender and nondistended. Normal bowel sounds. Extremities:  Without edema. Neurologic:  Alert and oriented,  grossly normal neurologically. Psych:  Cooperative. Normal mood and affect.   Lab Results:  Recent Labs  10/02/13 0533 10/02/13 0924 10/03/13 0653 10/04/13 0500  WBC 15.6*  --  7.0 4.7  HGB 13.7 13.3 11.3* 11.7*  HCT 38.6 39.0 32.9* 33.7*  PLT 83*  --  41* 46*   BMET  Recent Labs  10/02/13 0533 10/02/13 0924 10/03/13 0653 10/04/13 0500  NA 133* 137 136* 136*  K 5.8* 4.3 3.7 3.6*  CL 100 106 104 104  CO2 18*  --  22 20  GLUCOSE 108* 103* 98 85  BUN 7 5* 5* 6  CREATININE 0.63 0.70 0.66 0.60  CALCIUM 9.1  --  8.9 8.9   LFT  Recent Labs  10/04/13 0500  PROT 6.8  ALBUMIN 2.7*  AST 68*  ALT 45*  ALKPHOS 332*  BILITOT 4.3*   PT/INR  Recent Labs  10/03/13 0755  LABPROT 17.2*  INR 1.44    Studies/Results: Mr 3d Recon At Scanner  10/03/2013   CLINICAL DATA:  Elevated liver function tests. Abdominal pain, fever, and leukocytosis. Anemia. Sepsis.  EXAM: MRI ABDOMEN WITHOUT AND WITH CONTRAST (INCLUDING MRCP)  TECHNIQUE: Multiplanar multisequence MR imaging of the abdomen was performed both before and after the administration of intravenous contrast. Heavily T2-weighted images of the biliary and pancreatic ducts were obtained, and three-dimensional MRCP images were rendered by post processing.  CONTRAST:  20mL MULTIHANCE GADOBENATE DIMEGLUMINE 529  MG/ML IV SOLN  COMPARISON:  None.  FINDINGS: Advanced macronodular hepatic cirrhosis is demonstrated, as well as moderate splenomegaly and upper abdominal and esophageal varices, consistent with portal venous hypertension. The main and right portal vein are patent. There is severe left hepatic lobe atrophy likely due to chronic left portal vein thrombosis. No acute portal vein thrombus visualized.  The hepatic parenchyma shows heterogeneous pattern of central iron deposition, however no discrete hypervascular or T2 hyperintense hepatic masses or nodules are visualized. MRCP images are partially degraded by motion artifact, however there is no evidence of biliary or pancreatic ductal dilatation.  The pancreas and kidneys are normal in appearance. No evidence of adrenal mass. Mild lymphadenopathy noted in the porta hepatis, likely reactive in the setting of hepatic cirrhosis. No evidence of inflammatory process, ascites, or abscess. No evidence of dilated abdominal bowel loops.  IMPRESSION: Advanced macronodular hepatic cirrhosis. Diffuse left hepatic lobe atrophy noted, likely due to chronic left portal vein thrombosis.  No radiographic evidence of hepatic neoplasm or acute portal vein thrombosis.  Moderate splenomegaly and esophageal varices, consistent with portal venous hypertension. No evidence of ascites.  No evidence of biliary or pancreatic ductal dilatation.   Electronically Signed   By: Myles Rosenthal M.D.   On: 10/03/2013 13:38   Mr Abd W/wo Cm/mrcp  10/03/2013   CLINICAL DATA:  Elevated liver function tests.  Abdominal pain, fever, and leukocytosis. Anemia. Sepsis.  EXAM: MRI ABDOMEN WITHOUT AND WITH CONTRAST (INCLUDING MRCP)  TECHNIQUE: Multiplanar multisequence MR imaging of the abdomen was performed both before and after the administration of intravenous contrast. Heavily T2-weighted images of the biliary and pancreatic ducts were obtained, and three-dimensional MRCP images were rendered by post  processing.  CONTRAST:  20mL MULTIHANCE GADOBENATE DIMEGLUMINE 529 MG/ML IV SOLN  COMPARISON:  None.  FINDINGS: Advanced macronodular hepatic cirrhosis is demonstrated, as well as moderate splenomegaly and upper abdominal and esophageal varices, consistent with portal venous hypertension. The main and right portal vein are patent. There is severe left hepatic lobe atrophy likely due to chronic left portal vein thrombosis. No acute portal vein thrombus visualized.  The hepatic parenchyma shows heterogeneous pattern of central iron deposition, however no discrete hypervascular or T2 hyperintense hepatic masses or nodules are visualized. MRCP images are partially degraded by motion artifact, however there is no evidence of biliary or pancreatic ductal dilatation.  The pancreas and kidneys are normal in appearance. No evidence of adrenal mass. Mild lymphadenopathy noted in the porta hepatis, likely reactive in the setting of hepatic cirrhosis. No evidence of inflammatory process, ascites, or abscess. No evidence of dilated abdominal bowel loops.  IMPRESSION: Advanced macronodular hepatic cirrhosis. Diffuse left hepatic lobe atrophy noted, likely due to chronic left portal vein thrombosis.  No radiographic evidence of hepatic neoplasm or acute portal vein thrombosis.  Moderate splenomegaly and esophageal varices, consistent with portal venous hypertension. No evidence of ascites.  No evidence of biliary or pancreatic ductal dilatation.   Electronically Signed   By: Myles RosenthalJohn  Stahl M.D.   On: 10/03/2013 13:38     Assessment / Plan:   151. 38 year old female with cirrhosis complicated by portal HTN (splenomegaly, esophageal varices) . Cirrhosis newly diagnosed to the patient though imaging studies in 2002 showed portal HTN. Cirrhosis likely consequence of biliary atresia / ? hepatico-jejunostomy as an infan. tPatient will need eventual evaluation of varices and outpatient liver transplant evaluation.   2. Fever /abdominal  pain / Elevated LFTs. Blood cultures positive for GNR, Possibly cholangitis but also may be secondary to e.coli UTI. On Zosyn. LFTs improving. No biliary duct dilation on MRCP. No ascites on imaging. Continue antibiotics and LFT monitoring. INR 1.44. Fever resolved and taking PO, [facility]  LOS: 2 days   Willette Clusteraula Guenther  10/04/2013, 8:54 AM   ________________________________________________________________________  [facility] note:  I personally examined the patient, reviewed the data and agree with the assessment and plan described above.  She is really feeling better. EAting normally. 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 I advised daily OTC omemprazole for her.  I think that the E. Coli UTI, bactermia was inciting illness here but it unmasked her yet undiagnosed cirrhosis.  [facility] office will get in touch with her for office visit with Dr. Juanda ChanceBrodie in next several weeks to more formally address her cirrhosis.  I expect she will need transplant referral.     Rob Buntinganiel Graci Hulce, MD [facility] Pager [phone]

## 2013-10-05 MED ORDER — LEVOFLOXACIN 750 MG PO TABS: 750.0000 mg | ORAL_TABLET | Freq: Every day | ORAL | Status: DC

## 2013-10-05 MED ORDER — PANTOPRAZOLE SODIUM 40 MG PO TBEC: 40.0000 mg | DELAYED_RELEASE_TABLET | Freq: Every day | ORAL | Status: DC

## 2013-10-05 MED ORDER — DSS 100 MG PO CAPS: 100.0000 mg | ORAL_CAPSULE | Freq: Two times a day (BID) | ORAL | Status: DC

## 2013-10-05 NOTE — Discharge Summary (Signed)
Physician Discharge Summary  Dominique Christian ZOX:[id number] DOB: Apr 02, 1976 DOA: 10/02/2013  PCP: No primary provider on file.  Admit date: 10/02/2013 Discharge date: 10/05/2013  Time spent: >35 minutes  Recommendations for Outpatient Follow-up:  F/u with Dr. Juanda Chance as scheduled  Discharge Diagnoses:  Active Problems:   Sepsis   UTI (urinary tract infection)   Bilirubinemia   Abdominal pain   Tobacco abuse   GERD (gastroesophageal reflux disease)   Transaminitis   Discharge Condition: stable   Diet recommendation: heart healthy   Filed Weights   10/02/13 1107  Weight: 98 kg (216 lb 0.8 oz)    History of present illness:  38 y.o. 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 Sepsis/sirs/bacteremia/uti' blood cultures: +E coli;  -improved on IV atx, IVF; afebrile, no leukocytosis; d/w ID Dr. Orvan Falconer, convert to PO  Need 14 day treatment;  2. Hepatic cirrhosis, splenomegaly and portal venous hypertension, coagulopathy; + etoh use  -s/p surgery and gallbladder removal when she was 1-2 months old  -appreciate GI input; outpatient liver transplant referral per GI follow up and arrangements  -stop using etoh  3. tobacco abuse: cessation counseling provided. Patient declines nicotine patch  5. GERD (gastroesophageal reflux disease): continue PPI (will adjust to BID)    Procedures:  none (i.e. Studies not automatically included, echos, thoracentesis, etc; not x-rays)  Consultations:  GI  Discharge Exam: Filed Vitals:   10/05/13 0629  BP: 134/74  Pulse: 77  Temp: 97.8 F (36.6 C)  Resp: 18    General: alert Cardiovascular: s1,s2 rrr Respiratory: CTA BL  Discharge Instructions  Discharge Orders   Future Appointments Provider Department Dept Phone   11/09/2013  2:15 PM Hart Carwin, MD [facility] [phone]   Future Orders Complete By Expires   Diet - low sodium heart healthy  As directed    Discharge instructions  As directed    Comments:     Please follow up with gastroenterologist in 4-6 weeks  Please follow up with PCP in 1-2 weeks   Increase activity slowly  As directed        Medication List    STOP taking these medications       acetaminophen 500 MG tablet  Commonly known as:  TYLENOL     omeprazole 20 MG tablet  Commonly known as:  PRILOSEC OTC      TAKE these medications       DSS 100 MG Caps  Take 100 mg by mouth 2 (two) times daily.     levofloxacin 750 MG tablet  Commonly known as:  LEVAQUIN  Take 1 tablet (750 mg total) by mouth daily.     pantoprazole 40 MG tablet  Commonly known as:  PROTONIX  Take 1 tablet (40 mg total) by mouth daily.     ranitidine 150 MG capsule  Commonly known as:  ZANTAC  Take 150 mg by mouth 2 (two) times daily.       No Known Allergies     Follow-up Information   Follow up with Lina Sar, MD In 4 weeks.   Specialty:  Gastroenterology   Contact information:   [address] [place] [address] [phone]       Follow up with [facility]  In 2 weeks.   Contact information:   [address] [place] [address] [phone]       The results of significant diagnostics from this hospitalization (including imaging, microbiology, ancillary and laboratory) are listed below for reference.    Significant Diagnostic Studies: Dg Chest 2 View  10/02/2013   CLINICAL DATA:  Shortness of breath, fever.  EXAM: CHEST  2 VIEW  COMPARISON:  None available for comparison at time of study interpretation.  FINDINGS: Cardiomediastinal silhouette is unremarkable. The lungs are clear without pleural effusions or focal consolidations. Trachea projects midline and there is no pneumothorax. Soft tissue planes and  included osseous structures are non-suspicious.  IMPRESSION: No acute cardiopulmonary process.   Electronically Signed   By: Awilda Metro   On: 10/02/2013 06:57   Ct Abdomen Pelvis W Contrast  10/02/2013   CLINICAL DATA:  Increasing right lower abdominal pain with fever. Elevated white blood cell count.  EXAM: CT ABDOMEN AND PELVIS WITH CONTRAST  TECHNIQUE: Multidetector CT imaging of the abdomen and pelvis was performed using the standard protocol following bolus administration of intravenous contrast.  CONTRAST:  OMNIPAQUE IOHEXOL 300 MG/ML  SOLN  COMPARISON:  US TRANSVAGINAL NON-OB dated 06/27/2011  FINDINGS: Lung Bases: Clear.  Liver: Nodular contour of the liver consistent with hepatic cirrhosis. No focal mass lesion. Hypoplastic left hepatic lobe. Question partial hepatectomy.  Spleen:  Splenomegaly with 15 cm splenic span.  Gallbladder: Surgically absent compatible with prior cholecystectomy. Scarring in the right abdominal wall suggest prior open cholecystectomy.  Common bile duct: There is no dilation of the common bile duct. In the porta hepatis, 2 tiny loculated gas are present, probably representing pneumobilia associated with sphincterotomy (image 20 series 2). No intra-abdominal free air.  Pancreas:  Normal.  Adrenal glands:  Normal bilaterally.  Kidneys: Normal enhancement. Both ureters appear within normal limits. Negative for calculi.  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 There are no inflammatory changes of stomach.  Small bowel: Duodenum appears normal. Small bowel is within normal limits without a obstruction or inflammatory changes. No mesenteric adenopathy.  Colon: Normal appendix. Tiny appendicolith is present at the tip of the appendix. Colonic diverticulosis without diverticulitis. The sigmoid colon is decompressed. Mild wall thickening of the sigmoid is probably due to in underdistention rather than colitis. Mild bowel  wall thickening can also be associated with cirrhosis.  Pelvic Genitourinary: Urinary bladder appears normal, partially collapsed. Uterus and adnexa have a normal physiologic appearance.  Bones: No aggressive osseous lesions. 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  Vasculature: Minimal atherosclerosis.  Portal venous hypertension.  Body Wall: Scarring in the right abdominal wall. Mild edema in the posterior subcutaneous fat.  IMPRESSION: 1. Mild colonic mural thickening is probably secondary to nondistention and cirrhosis. Colitis could produce a similar appearance. 2. Hepatic cirrhosis, splenomegaly and portal venous hypertension. 3. Cholecystectomy. 4. Tiny amount of gas in the porta hepatis is probably within the common bile duct and represents pneumobilia, likely associated with prior surgery for biliary atresia. There is no intra-abdominal free air to suggest perforated viscus.   Electronically Signed   By: Andreas Newport M.D.   On: 10/02/2013 07:12   Mr 3d Recon At Scanner  10/03/2013   CLINICAL DATA:  Elevated liver function tests. Abdominal pain, fever, and leukocytosis. Anemia. Sepsis.  EXAM: MRI ABDOMEN WITHOUT AND WITH CONTRAST (INCLUDING MRCP)  TECHNIQUE: Multiplanar multisequence MR imaging of the abdomen was performed both before and  after the administration of intravenous contrast. Heavily T2-weighted images of the biliary and pancreatic ducts were obtained, and three-dimensional MRCP images were rendered by post processing.  CONTRAST:  20mL MULTIHANCE GADOBENATE DIMEGLUMINE 529 MG/ML IV SOLN  COMPARISON:  None.  FINDINGS: Advanced macronodular hepatic cirrhosis is demonstrated, as well as moderate splenomegaly and upper abdominal and esophageal varices, consistent with portal venous hypertension. The main and right portal vein are patent. There is severe left hepatic lobe atrophy likely due to chronic left portal vein thrombosis. No acute portal vein thrombus visualized.  The  hepatic parenchyma shows heterogeneous pattern of central iron deposition, however no discrete hypervascular or T2 hyperintense hepatic masses or nodules are visualized. MRCP images are partially degraded by motion artifact, however there is no evidence of biliary or pancreatic ductal dilatation.  The pancreas and kidneys are normal in appearance. No evidence of adrenal mass. Mild lymphadenopathy noted in the porta hepatis, likely reactive in the setting of hepatic cirrhosis. No evidence of inflammatory process, ascites, or abscess. No evidence of dilated abdominal bowel loops.  IMPRESSION: Advanced macronodular hepatic cirrhosis. Diffuse left hepatic lobe atrophy noted, likely due to chronic left portal vein thrombosis.  No radiographic evidence of hepatic neoplasm or acute portal vein thrombosis.  Moderate splenomegaly and esophageal varices, consistent with portal venous hypertension. No evidence of ascites.  No evidence of biliary or pancreatic ductal dilatation.   Electronically Signed   By: Myles Rosenthal M.D.   On: 10/03/2013 13:38   Mr Abd W/wo Cm/mrcp  10/03/2013   CLINICAL DATA:  Elevated liver function tests. Abdominal pain, fever, and leukocytosis. Anemia. Sepsis.  EXAM: MRI ABDOMEN WITHOUT AND WITH CONTRAST (INCLUDING MRCP)  TECHNIQUE: Multiplanar multisequence MR imaging of the abdomen was performed both before and after the administration of intravenous contrast. Heavily T2-weighted images of the biliary and pancreatic ducts were obtained, and three-dimensional MRCP images were rendered by post processing.  CONTRAST:  20mL MULTIHANCE GADOBENATE DIMEGLUMINE 529 MG/ML IV SOLN  COMPARISON:  None.  FINDINGS: Advanced macronodular hepatic cirrhosis is demonstrated, as well as moderate splenomegaly and upper abdominal and esophageal varices, consistent with portal venous hypertension. The main and right portal vein are patent. There is severe left hepatic lobe atrophy likely due to chronic left portal vein  thrombosis. No acute portal vein thrombus visualized.  The hepatic parenchyma shows heterogeneous pattern of central iron deposition, however no discrete hypervascular or T2 hyperintense hepatic masses or nodules are visualized. MRCP images are partially degraded by motion artifact, however there is no evidence of biliary or pancreatic ductal dilatation.  The pancreas and kidneys are normal in appearance. No evidence of adrenal mass. Mild lymphadenopathy noted in the porta hepatis, likely reactive in the setting of hepatic cirrhosis. No evidence of inflammatory process, ascites, or abscess. No evidence of dilated abdominal bowel loops.  IMPRESSION: Advanced macronodular hepatic cirrhosis. Diffuse left hepatic lobe atrophy noted, likely due to chronic left portal vein thrombosis.  No radiographic evidence of hepatic neoplasm or acute portal vein thrombosis.  Moderate splenomegaly and esophageal varices, consistent with portal venous hypertension. No evidence of ascites.  No evidence of biliary or pancreatic ductal dilatation.   Electronically Signed   By: Myles Rosenthal M.D.   On: 10/03/2013 13:38    Microbiology: Recent Results (from the past 240 hour(s))  CULTURE, BLOOD (ROUTINE X 2)     Status: None   Collection Time    10/02/13  5:14 AM      Result Value Ref Range Status  Specimen Description BLOOD LEFT ANTECUBITAL   Final   Special Requests BOTTLES DRAWN AEROBIC AND ANAEROBIC 5CC   Final   Culture  Setup Time     Final   Value: 10/02/2013 08:15     Performed at [facility]   Culture     Final   Value: ESCHERICHIA COLI     Note: Gram Stain Report Called to,Read Back By and Verified With: ASHTON MERRITT ON 10/02/2013 AT 11:59P BY WILEJ     Performed at [facility]   Report Status 10/04/2013 FINAL   Final   Organism ID, Bacteria ESCHERICHIA COLI   Final  CULTURE, BLOOD (ROUTINE X 2)     Status: None   Collection Time    10/02/13  5:21 AM      Result Value Ref Range Status    Specimen Description BLOOD RIGHT ANTECUBITAL   Final   Special Requests BOTTLES DRAWN AEROBIC AND ANAEROBIC 5CC   Final   Culture  Setup Time     Final   Value: 10/02/2013 08:15     Performed at [facility]   Culture     Final   Value: ESCHERICHIA COLI     Note: SUSCEPTIBILITIES PERFORMED ON PREVIOUS CULTURE WITHIN THE LAST 5 DAYS.     Note: Gram Stain Report Called to,Read Back By and Verified With: ASHTON MERRITT ON 10/02/2013 AT 11:59P BY WILEJ     Performed at [facility]   Report Status 10/04/2013 FINAL   Final  URINE CULTURE     Status: None   Collection Time    10/02/13  6:17 AM      Result Value Ref Range Status   Specimen Description URINE, CLEAN CATCH   Final   Special Requests NONE   Final   Culture  Setup Time     Final   Value: 10/02/2013 08:17     Performed at [facility] Count     Final   Value: >=100,000 COLONIES/ML     Performed at [facility]   Culture     Final   Value: ESCHERICHIA COLI     Performed at [facility]   Report Status 10/04/2013 FINAL   Final   Organism ID, Bacteria ESCHERICHIA COLI   Final     Labs: Basic Metabolic Panel:  Recent Labs Lab 10/02/13 0533 10/02/13 0924 10/02/13 1534 10/03/13 0653 10/04/13 0500  NA 133* 137  --  136* 136*  K 5.8* 4.3  --  3.7 3.6*  CL 100 106  --  104 104  CO2 18*  --   --  22 20  GLUCOSE 108* 103*  --  98 85  BUN 7 5*  --  5* 6  CREATININE 0.63 0.70  --  0.66 0.60  CALCIUM 9.1  --   --  8.9 8.9  MG  --   --  1.6  --   --    Liver Function Tests:  Recent Labs Lab 10/02/13 0533 10/03/13 0653 10/04/13 0500  AST 132* 75* 68*  ALT 63* 49* 45*  ALKPHOS 469* 353* 332*  BILITOT 3.5* 5.3* 4.3*  PROT 8.2 6.5 6.8  ALBUMIN 3.2* 2.5* 2.7*    Recent Labs Lab 10/02/13 0533  LIPASE 31   No results found for this basename: AMMONIA,  in the last 168 hours CBC:  Recent Labs Lab 10/02/13 0533 10/02/13 0924 10/03/13 0653 10/04/13 0500  WBC  15.6*  --  7.0 4.7  NEUTROABS 13.3*  --   --   --   HGB 13.7 13.3 11.3* 11.7*  HCT 38.6 39.0 32.9* 33.7*  MCV 90.4  --  91.6 90.3  PLT 83*  --  41* 46*   Cardiac Enzymes: No results found for this basename: CKTOTAL, CKMB, CKMBINDEX, TROPONINI,  in the last 168 hours BNP: BNP (last 3 results) No results found for this basename: PROBNP,  in the last 8760 hours CBG: No results found for this basename: GLUCAP,  in the last 168 hours     Signed:  Esperanza SheetsBURIEV, Darus Hershman N  [facility] 10/05/2013, 11:08 AM

## 2013-10-05 NOTE — Discharge Summary (Signed)
Patient discharged home with her mother.  I removed her IV and went over her discharge instructions and medications.

## 2013-10-10 ENCOUNTER — Encounter: Payer: Self-pay | Admitting: *Deleted

## 2013-11-09 ENCOUNTER — Ambulatory Visit (INDEPENDENT_AMBULATORY_CARE_PROVIDER_SITE_OTHER): Payer: BC Managed Care – PPO | Admitting: Internal Medicine

## 2013-11-09 ENCOUNTER — Encounter: Payer: Self-pay | Admitting: Internal Medicine

## 2013-11-09 ENCOUNTER — Other Ambulatory Visit (INDEPENDENT_AMBULATORY_CARE_PROVIDER_SITE_OTHER): Payer: BC Managed Care – PPO

## 2013-11-09 VITALS — BP 120/70 | HR 74 | Ht 63.0 in | Wt 215.0 lb

## 2013-11-09 DIAGNOSIS — I8500 Esophageal varices without bleeding: Secondary | ICD-10-CM

## 2013-11-09 DIAGNOSIS — K7460 Unspecified cirrhosis of liver: Secondary | ICD-10-CM

## 2013-11-09 DIAGNOSIS — R7989 Other specified abnormal findings of blood chemistry: Secondary | ICD-10-CM

## 2013-11-09 LAB — CBC WITH DIFFERENTIAL/PLATELET
Basophils Absolute: 0 10*3/uL (ref 0.0–0.1)
Basophils Relative: 0.4 % (ref 0.0–3.0)
Eosinophils Absolute: 0 10*3/uL (ref 0.0–0.7)
Eosinophils Relative: 0.6 % (ref 0.0–5.0)
HCT: 40.8 % (ref 36.0–46.0)
HEMOGLOBIN: 13.9 g/dL (ref 12.0–15.0)
Lymphocytes Relative: 28 % (ref 12.0–46.0)
Lymphs Abs: 1.7 10*3/uL (ref 0.7–4.0)
MCHC: 34.1 g/dL (ref 30.0–36.0)
MCV: 93.7 fl (ref 78.0–100.0)
MONOS PCT: 9.2 % (ref 3.0–12.0)
Monocytes Absolute: 0.5 10*3/uL (ref 0.1–1.0)
NEUTROS ABS: 3.7 10*3/uL (ref 1.4–7.7)
Neutrophils Relative %: 61.8 % (ref 43.0–77.0)
Platelets: 57 10*3/uL — ABNORMAL LOW (ref 150.0–400.0)
RBC: 4.36 Mil/uL (ref 3.87–5.11)
RDW: 16.5 % — AB (ref 11.5–15.5)
WBC: 5.9 10*3/uL (ref 4.0–10.5)

## 2013-11-09 LAB — HEPATIC FUNCTION PANEL
ALBUMIN: 3.1 g/dL — AB (ref 3.5–5.2)
ALT: 103 U/L — ABNORMAL HIGH (ref 0–35)
AST: 139 U/L — AB (ref 0–37)
Alkaline Phosphatase: 489 U/L — ABNORMAL HIGH (ref 39–117)
BILIRUBIN TOTAL: 2.8 mg/dL — AB (ref 0.2–1.2)
Bilirubin, Direct: 1.2 mg/dL — ABNORMAL HIGH (ref 0.0–0.3)
Total Protein: 7.1 g/dL (ref 6.0–8.3)

## 2013-11-09 LAB — AMMONIA: AMMONIA: 63 umol/L — AB (ref 11–35)

## 2013-11-09 MED ORDER — OMEPRAZOLE 40 MG PO CPDR: 40.0000 mg | DELAYED_RELEASE_CAPSULE | Freq: Every day | ORAL | Status: DC

## 2013-11-09 NOTE — Progress Notes (Signed)
Dominique Christian 18-Apr-1976 [id number]  Note: This dictation was prepared with Dragon digital system. Any transcriptional errors that result from this procedure are unintentional.   History of Present Illness:  This is a 38 year old African American female recently hospitalized between 10/02/2013- 10/05/2013 for abdominal pain and urinary sepsis. She had abnormal liver function tests, nausea, vomiting and urinary tract infection. A CT scan of the abdomen showed a nodular liver consistent with cirrhosis, possible partial hepatectomy versus left hepatic vein thrombosis, splenomegaly with a 15 cm splenic size. She is post cholecystectomy . Her common bile duct was not dilated. There was air in the porta hepatitis likely from previous surgery for liver atresia at birth. She was noted to have esophageal and gastric varices and marked thickening of the left colon. Her AST was 132, ALT 63, alkaline phosphatase 469 and albumin of 3.2. Her INR was 1.4, platelet count was 83,000. She responded to intravenous antibiotics and was sent home on antibiotics which were discontinued several weeks ago. She has no complaints today. Her level of energy is fine. There is no jaundice or abdominal pain. She is back to work full-time. She works out of her house. I saw her in the office 15 years ago. We are looking for her paper chart. She was also mildly anemic. She had a Hemoglobin of 11.7. She is taking iron supplements. She is currently not using any alcohol.    Past Medical History  Diagnosis Date  . Hepatic cirrhosis   . Anemia   . Splenomegaly   . Esophageal varices   . Portal venous hypertension   . Gastric varices   . Appendicolith   . Biliary atresia   . Migraines     Past Surgical History  Procedure Laterality Date  . Liver surgery      as a baby  . Ovarian cyst removal    . Knee sugery Right     rod from knee to ankle,   . Hand surgery Left     rod from hand to elbow.  . Cholecystectomy      at 1-2  months old for biliary atresia    No Known Allergies  Family history and social history have been reviewed.  Review of Systems: Weight gain ALT 20 pounds. Denies dysphagia. Positive for heartburn  The remainder of the 10 point ROS is negative except as outlined in the H&P  Physical Exam: General Appearance Well developed, in no distress Eyes  Non icteric  HEENT  Non traumatic, normocephalic  Mouth No lesion, tongue papillated, no cheilosis Neck Supple without adenopathy, thyroid not enlarged, no carotid bruits, no JVD Lungs Clear to auscultation bilaterally COR Normal S1, normal S2, regular rhythm, no murmur, quiet precordium Abdomen protuberant soft, nontender. Multiple well-healed surgical scars. No ascites. 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 She is currently diagnosed with stable cirrhosis. She needs to be further evaluated for portal hypertension. We will check  alpha fetoprotein today, venous ammonia, liver function tests. She will be scheduled for an upper endoscopy to assess for portal hypertensive gastropathy and esophageal varices. She will be started on Prilosec 40 mg daily because of symptomatic gastroesophageal reflux and risk of upper GI bleed. She was advised not to take any aspirin. She drinks occasionally and takes anti-inflammatory agents for  migraine headaches. Clinically, she is not a candidate for liver transplant at this time since the synthetic liver function is reasonably good.    Dominique Christian 11/09/2013

## 2013-11-09 NOTE — Patient Instructions (Addendum)
You have been scheduled for an endoscopy with propofol. Please follow written instructions given to you at your visit today. If you use inhalers (even only as needed), please bring them with you on the day of your procedure. Your physician has requested that you go to www.startemmi.com and enter the access code given to you at your visit today. This web site gives a general overview about your procedure. However, you should still follow specific instructions given to you by our office regarding your preparation for the procedure.  We have sent the following medications to your pharmacy for you to pick up at your convenience: Prilosec 40 mg daily  Your physician has requested that you go to the basement for the following lab work before leaving today: Ammonia, hepatic function, AFP, CBC  CC: Dominique LennoxScott Christian

## 2013-11-10 LAB — AFP TUMOR MARKER: AFP TUMOR MARKER: 1.9 ng/mL (ref 0.0–8.0)

## 2013-11-12 ENCOUNTER — Other Ambulatory Visit: Payer: Self-pay | Admitting: *Deleted

## 2013-11-12 DIAGNOSIS — K7460 Unspecified cirrhosis of liver: Secondary | ICD-10-CM

## 2013-11-12 MED ORDER — LACTULOSE 10 GM/15ML PO SOLN: ORAL | Status: DC

## 2013-11-13 ENCOUNTER — Encounter: Payer: Self-pay | Admitting: Internal Medicine

## 2013-11-14 ENCOUNTER — Encounter: Payer: Self-pay | Admitting: Internal Medicine

## 2013-11-14 ENCOUNTER — Ambulatory Visit (AMBULATORY_SURGERY_CENTER): Payer: BC Managed Care – PPO | Admitting: Internal Medicine

## 2013-11-14 VITALS — BP 117/59 | HR 87 | Temp 97.7°F | Resp 43 | Ht 63.0 in | Wt 215.0 lb

## 2013-11-14 DIAGNOSIS — K7460 Unspecified cirrhosis of liver: Secondary | ICD-10-CM

## 2013-11-14 DIAGNOSIS — Q442 Atresia of bile ducts: Secondary | ICD-10-CM

## 2013-11-14 DIAGNOSIS — I8500 Esophageal varices without bleeding: Secondary | ICD-10-CM

## 2013-11-14 HISTORY — PX: ESOPHAGOGASTRODUODENOSCOPY: SHX1529

## 2013-11-14 MED ORDER — SODIUM CHLORIDE 0.9 % IV SOLN: 500.0000 mL | INTRAVENOUS | Status: DC

## 2013-11-14 NOTE — Op Note (Addendum)
[facility] [address] [place], [id number]   ENDOSCOPY PROCEDURE REPORT  PATIENT: Marland KitchenSmith, Dominique D.  MR#: [id number] BIRTHDATE: [birth year] , 37  yrs. old GENDER: Female ENDOSCOPIST: Hart Carwinora M Kristopher Attwood, MD REFERRED: Francee NodalScott Gurley,PAC PROCEDURE DATE:  11/14/2013 PROCEDURE:  EGD, diagnostic ASA CLASS:     Class III INDICATIONS:  abnormal CT of the GI tract.   Recent hospitalization for sepsis.  Due to cirrhosis, portal hypertension, splenomegaly. History of biliary atresia surgically corrected at birth.  Chronic elevation of liver function tests.  MRCP on [place] portal hypertension esophageal and gastric varices. MEDICATIONS: propofol (Diprivan) 200mg  IV TOPICAL ANESTHETIC: Cetacaine Spray  DESCRIPTION OF PROCEDURE: After the risks benefits and alternatives of the procedure were thoroughly explained, informed consent was obtained.  The LB [id number] endoscope was introduced through the mouth and advanced to the second portion of the duodenum. Without limitations.  The instrument was slowly withdrawn as the mucosa was fully examined.      Dose: Proximal mid and distal esophageal mucosa appeared normal. Specifically there were no esophageal varices. There were no signs of portal hypertension in the esophagus. Endoscope traversed into the stomach to the resistance.  Stomach: The gastric folds and gastric antrum were unremarkable. Retroflexion of the endoscope did not show any evidence of gastric varices. The gastric antrum and pyloric outlet were normal  Duodenum duodenal bulb and descending duodenum was unremarkable. The endoscope traversed through second portion to third portion duodenum but no evidence of obstruction was noted[         The scope was then withdrawn from the patient and the procedure completed.  COMPLICATIONS: There were no complications. ENDOSCOPIC IMPRESSION: essentially normal upper endoscopy of the esophagus stomach and duodenum  without evidence of portal hypertensive gastropathy. or esophageal or gastric varices .This is different from recent  MRI of the abdomen which reported  presence of esophageal varices  RECOMMENDATIONS: pulse followup of liver function tests Treatment elevated ammonia Discuss referral to a Medical Center for consideration of liver transplant evaluation  REPEAT EXAM:  no  eSigned:  Hart Carwinora M Jolleen Seman, MD 11/14/2013 4:25 PM   CC:  PATIENT NAME:  Marland KitchenSmith, Dominique D. MR#: [id number]

## 2013-11-14 NOTE — Patient Instructions (Signed)

## 2013-11-14 NOTE — Progress Notes (Signed)
Report to pacu rn, vss, bbs=clear 

## 2013-11-15 ENCOUNTER — Telehealth: Payer: Self-pay | Admitting: *Deleted

## 2013-11-15 NOTE — Telephone Encounter (Signed)
  Follow up Call-  Call back number 11/14/2013  Post procedure Call Back phone  # [phone]  Permission to leave phone message Yes     Patient questions:  Do you have a fever, pain , or abdominal swelling? no Pain Score  0 *  Have you tolerated food without any problems? yes  Have you been able to return to your normal activities? yes  Do you have any questions about your discharge instructions: Diet   no Medications  no Follow up visit  no  Do you have questions or concerns about your Care? no  Actions: * If pain score is 4 or above: No action needed, pain <4.

## 2014-05-06 ENCOUNTER — Other Ambulatory Visit: Payer: Self-pay | Admitting: Internal Medicine

## 2014-08-22 ENCOUNTER — Emergency Department (HOSPITAL_COMMUNITY)
Admission: EM | Admit: 2014-08-22 | Discharge: 2014-08-22 | Disposition: A | Discharge status: Home / Self Care | Payer: BLUE CROSS/BLUE SHIELD | Attending: Emergency Medicine | Admitting: Emergency Medicine

## 2014-08-22 ENCOUNTER — Encounter (HOSPITAL_COMMUNITY): Payer: Self-pay | Admitting: Emergency Medicine

## 2014-08-22 ENCOUNTER — Emergency Department (HOSPITAL_COMMUNITY): Payer: BLUE CROSS/BLUE SHIELD

## 2014-08-22 DIAGNOSIS — S52551A Other extraarticular fracture of lower end of right radius, initial encounter for closed fracture: Secondary | ICD-10-CM | POA: Diagnosis not present

## 2014-08-22 DIAGNOSIS — G43909 Migraine, unspecified, not intractable, without status migrainosus: Secondary | ICD-10-CM | POA: Insufficient documentation

## 2014-08-22 DIAGNOSIS — Q442 Atresia of bile ducts: Secondary | ICD-10-CM | POA: Insufficient documentation

## 2014-08-22 DIAGNOSIS — Z79899 Other long term (current) drug therapy: Secondary | ICD-10-CM | POA: Insufficient documentation

## 2014-08-22 DIAGNOSIS — Y9289 Other specified places as the place of occurrence of the external cause: Secondary | ICD-10-CM | POA: Diagnosis not present

## 2014-08-22 DIAGNOSIS — W1830XA Fall on same level, unspecified, initial encounter: Secondary | ICD-10-CM | POA: Diagnosis not present

## 2014-08-22 DIAGNOSIS — S62101A Fracture of unspecified carpal bone, right wrist, initial encounter for closed fracture: Secondary | ICD-10-CM

## 2014-08-22 DIAGNOSIS — Z720 Tobacco use: Secondary | ICD-10-CM | POA: Insufficient documentation

## 2014-08-22 DIAGNOSIS — Z8719 Personal history of other diseases of the digestive system: Secondary | ICD-10-CM | POA: Insufficient documentation

## 2014-08-22 DIAGNOSIS — S6991XA Unspecified injury of right wrist, hand and finger(s), initial encounter: Secondary | ICD-10-CM | POA: Diagnosis present

## 2014-08-22 DIAGNOSIS — Y9389 Activity, other specified: Secondary | ICD-10-CM | POA: Insufficient documentation

## 2014-08-22 DIAGNOSIS — Y998 Other external cause status: Secondary | ICD-10-CM | POA: Diagnosis not present

## 2014-08-22 DIAGNOSIS — Z8679 Personal history of other diseases of the circulatory system: Secondary | ICD-10-CM | POA: Diagnosis not present

## 2014-08-22 DIAGNOSIS — Z862 Personal history of diseases of the blood and blood-forming organs and certain disorders involving the immune mechanism: Secondary | ICD-10-CM | POA: Insufficient documentation

## 2014-08-22 MED ORDER — HYDROCODONE-ACETAMINOPHEN 5-325 MG PO TABS: 1.0000 | ORAL_TABLET | Freq: Four times a day (QID) | ORAL | Status: DC | PRN

## 2014-08-22 NOTE — Discharge Instructions (Signed)
Cast or Splint Care Casts and splints support injured limbs and keep bones from moving while they heal.  HOME CARE  Keep the cast or splint uncovered during the drying period.  A plaster cast can take 24 to 48 hours to dry.  A fiberglass cast will dry in less than 1 hour.  Do not rest the cast on anything harder than a pillow for 24 hours.  Do not put weight on your injured limb. Do not put pressure on the cast. Wait for your doctor's approval.  Keep the cast or splint dry.  Cover the cast or splint with a plastic bag during baths or wet weather.  If you have a cast over your chest and belly (trunk), take sponge baths until the cast is taken off.  If your cast gets wet, dry it with a towel or blow dryer. Use the cool setting on the blow dryer.  Keep your cast or splint clean. Wash a dirty cast with a damp cloth.  Do not put any objects under your cast or splint.  Do not scratch the skin under the cast with an object. If itching is a problem, use a blow dryer on a cool setting over the itchy area.  Do not trim or cut your cast.  Do not take out the padding from inside your cast.  Exercise your joints near the cast as told by your doctor.  Raise (elevate) your injured limb on 1 or 2 pillows for the first 1 to 3 days. GET HELP IF:  Your cast or splint cracks.  Your cast or splint is too tight or too loose.  You itch badly under the cast.  Your cast gets wet or has a soft spot.  You have a bad smell coming from the cast.  You get an object stuck under the cast.  Your skin around the cast becomes red or sore.  You have new or more pain after the cast is put on. GET HELP RIGHT AWAY IF:  You have fluid leaking through the cast.  You cannot move your fingers or toes.  Your fingers or toes turn blue or white or are cool, painful, or puffy (swollen).  You have tingling or lose feeling (numbness) around the injured area.  You have bad pain or pressure under the  cast.  You have trouble breathing or have shortness of breath.  You have chest pain. Document Released: 10/21/2010 Document Revised: 02/21/2013 Document Reviewed: 12/28/2012 [facility] Patient Information 2015 [employer] [place]. This information is not intended to replace advice given to you by your health care provider. Make sure you discuss any questions you have with your health care provider. You have a nondisplaced wrist fracture, which means that the bones are in good alignment.  You have been placed in a splint and given a referral to Dr. Cheree DittoGraham at who is the hand surgeon, please make an appointment with his office to have a more permanent cast placed.  He been given a prescription for pain medication that you can use for severe pain.  Otherwise, I would recommend taking Tylenol or ibuprofen

## 2014-08-22 NOTE — ED Provider Notes (Signed)
CSN: [id number]     Arrival date & time 08/22/14  2043 History  This chart was scribed for non-physician practitioner, Arman Filter, NP, working with Donnetta Hutching, MD, by Roxy Cedar ED Scribe. This patient was seen in room WTR8/WTR8 and the patient's care was started at 8:54 PM   Chief Complaint  Patient presents with  . Wrist Injury    right  . Fall   Patient is a 39 y.o. female presenting with wrist injury and fall. The history is provided by the patient. No language interpreter was used.  Wrist Injury Fall   HPI Comments: Dominique Christian is a 39 y.o. female who presents to the Emergency Department complaining of moderate right wrist pain due to fall that occurred earlier today when patient tripped over dog. She reports associated swelling and redness to right wrist.   Past Medical History  Diagnosis Date  . Hepatic cirrhosis   . Anemia   . Splenomegaly   . Esophageal varices   . Portal venous hypertension   . Gastric varices   . Appendicolith   . Biliary atresia   . Migraines    Past Surgical History  Procedure Laterality Date  . Liver surgery      as a baby  . Ovarian cyst removal    . Knee sugery Right     rod from knee to ankle,   . Hand surgery Left     rod from hand to elbow.  . Cholecystectomy      at 1-2 months old for biliary atresia   Family History  Problem Relation Age of Onset  . Hypertension Mother   . Hypertension Sister   . Breast cancer Maternal Grandmother   . Colon cancer Neg Hx    History  Substance Use Topics  . Smoking status: Current Every Day Smoker -- 0.25 packs/day for 10 years    Types: Cigarettes  . Smokeless tobacco: Never Used     Comment: tobacco info given 11/09/13  . Alcohol Use: No     Comment: former   OB History    Gravida Para Term Preterm AB TAB SAB Ectopic Multiple Living   0              Review of Systems  Musculoskeletal: Positive for arthralgias.  All other systems reviewed and are negative.  Allergies   Review of patient's allergies indicates no known allergies.  Home Medications   Prior to Admission medications   Medication Sig Start Date End Date Taking? Authorizing Provider  ketoprofen (ORUDIS) 75 MG capsule Take 75 mg by mouth 3 (three) times daily as needed. For headaches    Historical Provider, MD  lactulose (CHRONULAC) 10 GM/15ML solution Take 30 cc po daily. May decrease to 15 cc po if too much diarrhea Patient not taking: Reported on 08/22/2014 11/12/13   Hart Carwin, MD  omeprazole (PRILOSEC) 40 MG capsule TAKE 1 CAPSULE (40 MG TOTAL) BY MOUTH DAILY. Patient not taking: Reported on 08/22/2014 05/06/14   Hart Carwin, MD  rizatriptan (MAXALT) 10 MG tablet Take 10 mg by mouth as needed for migraine. May repeat in 2 hours if needed    Historical Provider, MD   Triage Vitals; BP 134/58 mmHg  Pulse 86  Temp(Src) 98.1 F (36.7 C) (Oral)  Resp 17  SpO2 100%  LMP 08/13/2014 (Approximate)  Physical Exam  ED Course  Procedures (including critical care time)  DIAGNOSTIC STUDIES: Oxygen Saturation is 100% on RA, normal by  my interpretation.    COORDINATION OF CARE: 8:55 PM- Discussed plans to order diagnostic imaging for right forearm, wrist and hand. Pt advised of plan for treatment and pt agrees.  Labs Review Labs Reviewed - No data to display  Imaging Review Dg Forearm Right  08/22/2014   CLINICAL DATA:  Tripped over dog and fell on outstretched hand.  EXAM: RIGHT FOREARM - 2 VIEW  COMPARISON:  None.  FINDINGS: There is a nondisplaced anatomically aligned transverse fracture of the distal radial metaphysis. The fracture appears to spare the articular surface. The ulna appears intact.  IMPRESSION: Nondisplaced anatomically aligned transverse distal radius fracture   Electronically Signed   By: Ellery Plunkaniel R Mitchell M.D.   On: 08/22/2014 21:19   Dg Wrist Complete Right  08/22/2014   CLINICAL DATA:  Pt presents to the Emergency Department complaining of moderate right wrist pain  due to fall that occurred earlier today when patient tripped over dog and landed on right hand and wrist. She reports associated swelling and redness to right wrist with pain concentrated at wrist and radiating up forearm  EXAM: RIGHT WRIST - COMPLETE 3+ VIEW  COMPARISON:  None.  FINDINGS: There is a transverse nondisplaced fracture across the distal radial metaphysis. There is no articular surface component to this fracture. No significant comminution. No angulation.  There are no other fractures. Wrist joints are normally spaced and aligned. There is mild surrounding soft tissue swelling.  IMPRESSION: Nondisplaced, nonangulated transverse fracture of the distal right radial metaphysis.   Electronically Signed   By: Amie Portlandavid  Ormond M.D.   On: 08/22/2014 21:18   Dg Hand Complete Right  08/22/2014   CLINICAL DATA:  Pt presents to the Emergency Department complaining of moderate right wrist pain due to fall that occurred earlier today when patient tripped over dog and landed on right hand and wrist. She reports associated swelling and redness to right wrist with pain concentrated at wrist and radiating up forearm.  EXAM: RIGHT HAND - COMPLETE 3+ VIEW  COMPARISON:  None.  FINDINGS: There is a transverse fracture of the distal radial metaphysis. Fracture is nondisplaced and nonangulated.  No fractures of the right hand. Wrist and hand joints are normally spaced and aligned. No arthropathic changes. Soft tissues are unremarkable.  IMPRESSION: 1. No hand fracture or dislocation. 2. Transverse nondisplaced fracture of the distal right radial metaphysis.   Electronically Signed   By: Amie Portlandavid  Ormond M.D.   On: 08/22/2014 21:14     EKG Interpretation None     patient was place in sugar tong splint, referred to [facility] at [facility].  He has a relationship with Dr. Luiz BlareGraves.  She can follow-up either physician MDM   Final diagnoses:  None    I personally performed the services described in this documentation, which  was scribed in my presence. The recorded information has been reviewed and is accurate.  Arman FilterGail K Shizuko Wojdyla, NP 08/22/14 [phone]  Donnetta HutchingBrian Cook, MD 08/24/14 [phone]

## 2014-08-22 NOTE — ED Notes (Signed)
Pt fell on her right wrist around an hour ago. No obvious deformity but does have swelling. Previous injury but no previous surgeries on the right wrist. Has not taken any pain medication. No other c/c.

## 2015-07-07 ENCOUNTER — Emergency Department (INDEPENDENT_AMBULATORY_CARE_PROVIDER_SITE_OTHER)
Admission: EM | Admit: 2015-07-07 | Discharge: 2015-07-07 | Disposition: A | Discharge status: Home / Self Care | Payer: BLUE CROSS/BLUE SHIELD | Source: Home / Self Care | Attending: Family Medicine | Admitting: Family Medicine

## 2015-07-07 ENCOUNTER — Encounter (HOSPITAL_COMMUNITY): Payer: Self-pay | Admitting: *Deleted

## 2015-07-07 ENCOUNTER — Emergency Department (INDEPENDENT_AMBULATORY_CARE_PROVIDER_SITE_OTHER): Payer: BLUE CROSS/BLUE SHIELD

## 2015-07-07 DIAGNOSIS — J410 Simple chronic bronchitis: Secondary | ICD-10-CM

## 2015-07-07 DIAGNOSIS — Z720 Tobacco use: Secondary | ICD-10-CM

## 2015-07-07 DIAGNOSIS — J40 Bronchitis, not specified as acute or chronic: Principal | ICD-10-CM

## 2015-07-07 MED ORDER — LEVOFLOXACIN 500 MG PO TABS: 500.0000 mg | ORAL_TABLET | Freq: Every day | ORAL | Status: DC

## 2015-07-07 MED ORDER — IPRATROPIUM BROMIDE 0.02 % IN SOLN
0.5000 mg | Freq: Once | RESPIRATORY_TRACT | Status: AC
  Administered 2015-07-07: 0.5 mg via RESPIRATORY_TRACT

## 2015-07-07 MED ORDER — PREDNISONE 50 MG PO TABS: ORAL_TABLET | ORAL | Status: DC

## 2015-07-07 MED ORDER — IPRATROPIUM BROMIDE 0.02 % IN SOLN
RESPIRATORY_TRACT | Status: AC
  Filled 2015-07-07: qty 2.5

## 2015-07-07 MED ORDER — METHYLPREDNISOLONE SODIUM SUCC 125 MG IJ SOLR
125.0000 mg | Freq: Once | INTRAMUSCULAR | Status: AC
  Administered 2015-07-07: 125 mg via INTRAMUSCULAR

## 2015-07-07 MED ORDER — ALBUTEROL SULFATE (2.5 MG/3ML) 0.083% IN NEBU
5.0000 mg | INHALATION_SOLUTION | Freq: Once | RESPIRATORY_TRACT | Status: AC
  Administered 2015-07-07: 5 mg via RESPIRATORY_TRACT

## 2015-07-07 MED ORDER — METHYLPREDNISOLONE SODIUM SUCC 125 MG IJ SOLR
INTRAMUSCULAR | Status: AC
  Filled 2015-07-07: qty 2

## 2015-07-07 MED ORDER — ALBUTEROL SULFATE HFA 108 (90 BASE) MCG/ACT IN AERS: 2.0000 | INHALATION_SPRAY | Freq: Four times a day (QID) | RESPIRATORY_TRACT | Status: AC | PRN

## 2015-07-07 MED ORDER — ALBUTEROL SULFATE (2.5 MG/3ML) 0.083% IN NEBU
INHALATION_SOLUTION | RESPIRATORY_TRACT | Status: AC
  Filled 2015-07-07: qty 6

## 2015-07-07 NOTE — ED Provider Notes (Signed)
CSN: [id number]     Arrival date & time 07/07/15  1706 History   First MD Initiated Contact with Patient 07/07/15 1912     Chief Complaint  Patient presents with  . URI   (Consider location/radiation/quality/duration/timing/severity/associated sxs/prior Treatment) Patient is a 40 y.o. female presenting with URI. The history is provided by the patient.  URI Presenting symptoms: congestion, cough and fever   Severity:  Moderate Onset quality:  Gradual Duration:  5 days Progression:  Worsening Chronicity:  New Relieved by:  None tried Worsened by:  Nothing tried Associated symptoms: wheezing     Past Medical History  Diagnosis Date  . Hepatic cirrhosis (HCC)   . Anemia   . Splenomegaly   . Esophageal varices (HCC)   . Portal venous hypertension (HCC)   . Gastric varices   . Appendicolith   . Biliary atresia   . Migraines    Past Surgical History  Procedure Laterality Date  . Liver surgery      as a baby  . Ovarian cyst removal    . Knee sugery Right     rod from knee to ankle,   . Hand surgery Left     rod from hand to elbow.  . Cholecystectomy      at 1-2 months old for biliary atresia   Family History  Problem Relation Age of Onset  . Hypertension Mother   . Hypertension Sister   . Breast cancer Maternal Grandmother   . Colon cancer Neg Hx    Social History  Substance Use Topics  . Smoking status: Current Every Day Smoker -- 0.25 packs/day for 10 years    Types: Cigarettes  . Smokeless tobacco: Never Used     Comment: tobacco info given 11/09/13  . Alcohol Use: No     Comment: former   OB History    Gravida Para Term Preterm AB TAB SAB Ectopic Multiple Living   0              Review of Systems  Constitutional: Positive for fever and appetite change.  HENT: Positive for congestion and postnasal drip.   Respiratory: Positive for cough and wheezing.   Cardiovascular: Negative.   All other systems reviewed and are negative.   Allergies  Review of  patient's allergies indicates no known allergies.  Home Medications   Prior to Admission medications   Medication Sig Start Date End Date Taking? Authorizing Provider  albuterol (PROVENTIL HFA;VENTOLIN HFA) 108 (90 Base) MCG/ACT inhaler Inhale 2 puffs into the lungs every 6 (six) hours as needed for wheezing or shortness of breath. Patient not taking: Reported on 07/09/2015 07/07/15   Linna HoffJames D Regie Bunner, MD  levofloxacin (LEVAQUIN) 500 MG tablet Take 1 tablet (500 mg total) by mouth daily. Patient not taking: Reported on 07/09/2015 07/07/15   Linna HoffJames D Meerab Maselli, MD  predniSONE (DELTASONE) 50 MG tablet 1 tab daily for 2 days then 1/2 tab daily for 2 days. Patient not taking: Reported on 07/09/2015 07/07/15   Linna HoffJames D Fredi Hurtado, MD   Meds Ordered and Administered this Visit   Medications  albuterol (PROVENTIL) (2.5 MG/3ML) 0.083% nebulizer solution 5 mg (5 mg Nebulization Given 07/07/15 2017)  ipratropium (ATROVENT) nebulizer solution 0.5 mg (0.5 mg Nebulization Given 07/07/15 2017)  methylPREDNISolone sodium succinate (SOLU-MEDROL) 125 mg/2 mL injection 125 mg (125 mg Intramuscular Given 07/07/15 2038)    BP 136/84 mmHg  Pulse 78  Temp(Src) 98.6 F (37 C) (Oral)  Resp 18  SpO2 94%  LMP 10/05/2014 (Approximate) No data found.   Physical Exam  Constitutional: She is oriented to person, place, and time. She appears well-developed and well-nourished. She appears distressed.  HENT:  Right Ear: External ear normal.  Left Ear: External ear normal.  Mouth/Throat: Oropharynx is clear and moist.  Eyes: Pupils are equal, round, and reactive to light.  Neck: Normal range of motion. Neck supple.  Cardiovascular: Regular rhythm and normal heart sounds.   Pulmonary/Chest: Effort normal. She has wheezes.  Neurological: She is alert and oriented to person, place, and time.  Skin: Skin is warm and dry.  Nursing note and vitals reviewed.   ED Course  Procedures (including critical care time)  Labs Review Labs Reviewed  - No data to display  Imaging Review No results found. X-rays reviewed and report per radiologist.   Visual Acuity Review  Right Eye Distance:   Left Eye Distance:   Bilateral Distance:    Right Eye Near:   Left Eye Near:    Bilateral Near:         MDM   1. Bronchitis due to tobacco use (HCC)    Sx improved at d/c.    Linna Hoff, MD 07/10/15 2036

## 2015-07-07 NOTE — ED Notes (Signed)
Pt  Reports  Symptoms  Of  Cough/  Congestion  With    Onset  Of  5  Days      pT IS  HOARSE    AND  IS  CONGESTED  AND  IS  A  SMOKER

## 2015-07-09 ENCOUNTER — Ambulatory Visit (INDEPENDENT_AMBULATORY_CARE_PROVIDER_SITE_OTHER): Payer: BLUE CROSS/BLUE SHIELD | Admitting: Family Medicine

## 2015-07-09 ENCOUNTER — Encounter: Payer: Self-pay | Admitting: Family Medicine

## 2015-07-09 VITALS — BP 124/78 | HR 68 | Temp 98.0°F | Wt 227.6 lb

## 2015-07-09 DIAGNOSIS — J209 Acute bronchitis, unspecified: Secondary | ICD-10-CM

## 2015-07-09 DIAGNOSIS — R059 Cough, unspecified: Secondary | ICD-10-CM

## 2015-07-09 DIAGNOSIS — R05 Cough: Secondary | ICD-10-CM | POA: Diagnosis not present

## 2015-07-09 DIAGNOSIS — J040 Acute laryngitis: Secondary | ICD-10-CM | POA: Diagnosis not present

## 2015-07-09 MED ORDER — ALBUTEROL SULFATE (2.5 MG/3ML) 0.083% IN NEBU
2.5000 mg | INHALATION_SOLUTION | Freq: Once | RESPIRATORY_TRACT | Status: AC
  Administered 2015-07-09: 2.5 mg via RESPIRATORY_TRACT

## 2015-07-09 MED ORDER — CEFTRIAXONE SODIUM 1 G IJ SOLR
250.0000 mg | Freq: Once | INTRAMUSCULAR | Status: AC
  Administered 2015-07-09: 250 mg via INTRAMUSCULAR

## 2015-07-09 NOTE — Patient Instructions (Addendum)
Take mucinex DM or Robitussin DM. Use the albuterol inhaler 2 puffs every 6 hours as needed for cough and wheezing for the next 2-3 days. You should be improving at this point and if you continue to need the inhaler beyond this let me know. Drink plenty of fluids, water mainly. Start the antibiotic as soon as possible and if you can afford the oral steroid, I recommend this as well.  Follow up in 2 weeks or sooner if no improvement.

## 2015-07-09 NOTE — Progress Notes (Signed)
Subjective:    Patient ID: Dominique Christian, female    DOB: 15-Apr-1976, 40 y.o.   MRN: [id number]  HPI Chief Complaint  Patient presents with  . new pt    new pt. lost voice coughing all the the time. went to urgent care yesterday and has bronchitis   She is new to the practice and here to establish primary care. She is a 40 year old female with a history of cirrhosis here for an acute complaint. States she developed upper respiratory symptoms on 06/30/15 including drainage, cough, chest hurting with coughing, wheezing and feels like she cannot get a deep breath without coughing. She states she lost her voice 3 days ago. States she feels like she is getting worse instead of better. She denies smoking but states she lives with people who do smoke. Her clothes smell of smoke.  Has been tylenol cough at home.  She was seen in the emergency department 2 days ago and diagnosed with bronchitis related to smoking. She was prescribed an antibiotic and short course of prednisone as well as an albuterol inhaler. She states she has not had money to pick up these medications. She states she cannot work because she talks on the phone for work. She is currently getting short term disability.  Reports history of GERD but states she is not currently taking reflux medication. Denies recent flares.   Other providers: Dr. Dickie La at [facility] for liver disease. No medications.   Review of Systems Pertinent positives and negatives in the history of present illness.    Objective:   Physical Exam  Constitutional: She is oriented to person, place, and time. She appears well-developed and well-nourished. No distress.  HENT:  Right Ear: Tympanic membrane and ear canal normal.  Left Ear: Tympanic membrane and ear canal normal.  Nose: Nose normal.  Mouth/Throat: Uvula is midline and mucous membranes are normal. Posterior oropharyngeal erythema present. No oropharyngeal exudate or posterior oropharyngeal edema.  Neck:  Trachea normal, normal range of motion and full passive range of motion without pain. Neck supple.  Cardiovascular: Normal rate, regular rhythm, normal heart sounds and intact distal pulses.   Pulmonary/Chest: Effort normal. She has wheezes.  Wheezing to all lung fields.  Lymphadenopathy:    She has no cervical adenopathy.  No head, cervical, or supraclavicular adenopathy  Neurological: She is alert and oriented to person, place, and time.  Skin: Skin is warm and dry. No rash noted. No cyanosis. No pallor. Nails show no clubbing.   BP 124/78 mmHg  Pulse 68  Temp(Src) 98 F (36.7 C) (Oral)  Wt 227 lb 9.6 oz (103.239 kg)  LMP 10/05/2014 (Approximate)  Wheezing to all lung fields after initial breathing treatment. Repeated breathing treatment. Wheezing improved but not clear throughout. Coughing increased. Patient reports feeling some better.  spo2 98% and pulse 99.      Assessment & Plan:  Cough  Acute bronchitis, unspecified organism - Plan: CBC with Differential  Laryngitis, acute  Breathing treatment given in office, patient still with wheezing to left lung fields. A second breathing treatment given with some relief of wheezing and chest tightness. Discussed patient with Dr Susann Givens.  Rocephin 250 mg IM given in office. Patient observed and no adverse reaction. Albuterol inhaler respiclick sample given with instructions for use. Discussed that I recommend she call and see how much the antibiotic is and start taking it as soon as possible. Encouraged her to give [place] a call if the antibiotic is too  expensive and we can switch to a cheaper one that would still work for her. Discussed that I expect her voice to return in the next few days, this is most likely due to post nasal drainage and cough. Congratulated her on stopping smoking however discussed that she is still exposed to significant secondhand smoke which is not good for her breathing. Recommend staying well hydrated, taking Mucinex  DM or Robitussin-DM. She'll let me know if not improving in the next 2-3 days and if she is not back to baseline after completing the antibiotic.

## 2015-07-10 ENCOUNTER — Encounter: Payer: Self-pay | Admitting: Internal Medicine

## 2015-07-10 LAB — CBC WITH DIFFERENTIAL/PLATELET
BASOS ABS: 0 10*3/uL (ref 0.0–0.1)
Basophils Relative: 0 % (ref 0–1)
Eosinophils Absolute: 0 10*3/uL (ref 0.0–0.7)
Eosinophils Relative: 0 % (ref 0–5)
HEMATOCRIT: 41.8 % (ref 36.0–46.0)
HEMOGLOBIN: 14.5 g/dL (ref 12.0–15.0)
LYMPHS PCT: 30 % (ref 12–46)
Lymphs Abs: 2.2 10*3/uL (ref 0.7–4.0)
MCH: 32.3 pg (ref 26.0–34.0)
MCHC: 34.7 g/dL (ref 30.0–36.0)
MCV: 93.1 fL (ref 78.0–100.0)
Monocytes Absolute: 0.4 10*3/uL (ref 0.1–1.0)
Monocytes Relative: 6 % (ref 3–12)
NEUTROS ABS: 4.7 10*3/uL (ref 1.7–7.7)
Neutrophils Relative %: 64 % (ref 43–77)
Platelets: 59 10*3/uL — ABNORMAL LOW (ref 150–400)
RBC: 4.49 MIL/uL (ref 3.87–5.11)
RDW: 16.9 % — ABNORMAL HIGH (ref 11.5–15.5)
WBC: 7.3 10*3/uL (ref 4.0–10.5)

## 2015-07-16 ENCOUNTER — Encounter: Payer: Self-pay | Admitting: Internal Medicine

## 2015-07-16 ENCOUNTER — Telehealth: Payer: Self-pay | Admitting: Family Medicine

## 2015-07-16 NOTE — Telephone Encounter (Signed)
Faxed over letter and ov notes from 07/09/15 along with diability papers from [employer].

## 2015-07-16 NOTE — Telephone Encounter (Signed)
Called and spoke with patient regarding her symptoms and paperwork for disability from her job. Patient states she continues to have cough and postnasal drainage and states her voice is not back to normal. I discussed that I can understand her clearly over the telephone. She reports feeling at least 50% improved. She has a follow up appointment scheduled with me on 07/27/2014, however, I recommended that if she was not improving then she should be seen sooner. She denies any new symptoms such as fever, chills, body aches.  She was seen by me on 07/08/2014 for diagnosis acute bronchitis and laryngitis. She was seen on 07/06/2014 per medical records at an urgent care and was prescribed an antibiotic and steroids for same. She states she delayed starting the medications due to financial issue and did not start them until 07/10/2014.  I discussed with her that I did not take her out of work due to her symptoms. Patient states she is unable to work. I discussed with patient that It would be inappropriate and fraud for me to sign disability paperwork since I did not take her out of work. She is aware that I am not approving disability for her current illness.

## 2015-07-17 ENCOUNTER — Telehealth: Payer: Self-pay | Admitting: Family Medicine

## 2015-07-17 ENCOUNTER — Encounter: Payer: Self-pay | Admitting: Family Medicine

## 2015-07-17 NOTE — Telephone Encounter (Signed)
Spoke with Dominique Christian Henson and she is agreeable to an out of work note for 1/4 and 07/10/15.

## 2015-07-17 NOTE — Telephone Encounter (Signed)
Sent copy of note to patient and [employer].  Advised pt of same

## 2015-07-17 NOTE — Telephone Encounter (Signed)
Pt requesting to speak to Vickie regarding disability forms that were sent to her job because her job just informed pt that disability was denied from the day she was seen by Larene BeachVickie until today based on info the [employer] sent in. Pt wants to know if this is correct and if Vickie does feel like pt should have been at work because from pt's understanding from talking to Larene BeachVickie was that  Vickie felt that she was not able to work since she could not talk.

## 2015-07-17 NOTE — Telephone Encounter (Signed)
Called patient to see if there was anything I could help her with.  Pt advised she doesn't understand why Vickie will not approve the disability from 1/2 til today.  That she was going back to work today.  I explained that Vickie did not feel she needed to be out of work.  And that she could check with the Urgent Care and see if they would approve that Vickie was not going to approve.  I explained also that waiting from 1/2 until 1/6 to get her antibiotic was also slowing down her recovery.   She states the day she was here on 1/4 that she could hardly talk that Vickie just about could not understand her.  So I advised her I would see if Vickie would give her an out of work note for that day that she was here and I would call her back.

## 2015-07-18 ENCOUNTER — Encounter: Payer: Self-pay | Admitting: Family Medicine

## 2015-07-18 ENCOUNTER — Ambulatory Visit (INDEPENDENT_AMBULATORY_CARE_PROVIDER_SITE_OTHER): Payer: BLUE CROSS/BLUE SHIELD | Admitting: Family Medicine

## 2015-07-18 VITALS — BP 128/74 | HR 88 | Temp 98.3°F | Resp 16 | Wt 225.8 lb

## 2015-07-18 DIAGNOSIS — R51 Headache: Secondary | ICD-10-CM

## 2015-07-18 DIAGNOSIS — R519 Headache, unspecified: Secondary | ICD-10-CM

## 2015-07-18 DIAGNOSIS — Z8719 Personal history of other diseases of the digestive system: Secondary | ICD-10-CM | POA: Diagnosis not present

## 2015-07-18 DIAGNOSIS — R112 Nausea with vomiting, unspecified: Secondary | ICD-10-CM

## 2015-07-18 LAB — COMPREHENSIVE METABOLIC PANEL
ALK PHOS: 482 U/L — AB (ref 33–115)
ALT: 62 U/L — AB (ref 6–29)
AST: 138 U/L — ABNORMAL HIGH (ref 10–30)
Albumin: 3 g/dL — ABNORMAL LOW (ref 3.6–5.1)
BUN: 7 mg/dL (ref 7–25)
CALCIUM: 8.5 mg/dL — AB (ref 8.6–10.2)
CO2: 25 mmol/L (ref 20–31)
Chloride: 106 mmol/L (ref 98–110)
Creat: 0.57 mg/dL (ref 0.50–1.10)
Glucose, Bld: 95 mg/dL (ref 65–99)
POTASSIUM: 3.4 mmol/L — AB (ref 3.5–5.3)
Sodium: 140 mmol/L (ref 135–146)
Total Bilirubin: 4.3 mg/dL — ABNORMAL HIGH (ref 0.2–1.2)
Total Protein: 6.7 g/dL (ref 6.1–8.1)

## 2015-07-18 MED ORDER — ONDANSETRON HCL 4 MG PO TABS: 4.0000 mg | ORAL_TABLET | Freq: Three times a day (TID) | ORAL | Status: AC | PRN

## 2015-07-18 MED ORDER — ASPIRIN-ACETAMINOPHEN-CAFFEINE 250-250-65 MG PO TABS: 1.0000 | ORAL_TABLET | Freq: Four times a day (QID) | ORAL | Status: DC | PRN

## 2015-07-18 NOTE — Progress Notes (Signed)
Subjective:    Patient ID: Dominique KitchenJeanne D Christian, female    DOB: 07-27-1975, 40 y.o.   MRN: [id number]  HPI Chief Complaint  Patient presents with  . fainted    fainted this morning. fever 102 last night and went down to normal. some dizziness.    She is here with complaints of fainting this morning. She states she woke up on the floor and her boyfriend was there. She states she thinks she fainted because she was feeling nauseated and vomited a couple of times last night that she was getting a migraine headache. States headache was bilateral temporal and felt like a stabbing sensation. She does report associated photophobia and phonophobia which is normal for her headaches. She reports a history of migraines without aura and was getting them 1-2 times per week in past and they would last 1-2 hours. She now reports she gets approximately 1 per week or every other week. She states she takes excedrin migraine at onset and this usually takes care of it. States last nights migraine was worse than usual. States she did not have any medication to take last night. This migraine lasted 2 hours and she fell asleep. Has never seen a neurologist. States she was diagnosed 2 years ago at [facility]. She will get medical records sent to me.  Denies injuries from fall. Denies neck pain. She states she did not hit her head.  States she had a fever last night of 102 and did not take any medications and this morning temperature was normal.  Very miild headache at present.  She states continues to feel slightly dizzy.  States she has felt nauseated today and has not eaten.   She has a history of cirrhosis and has not followed up with her gastroenterologist and does not have a future appointment.    Review of Systems Pertinent positives and negatives in the history of present illness.    Objective:   Physical Exam  Constitutional: She is oriented to person, place, and time. She appears well-developed and well-nourished.  No distress.  HENT:  Right Ear: Hearing, tympanic membrane and ear canal normal.  Left Ear: Hearing, tympanic membrane and ear canal normal.  Nose: Nose normal.  Mouth/Throat: Uvula is midline, oropharynx is clear and moist and mucous membranes are normal.  Eyes: Pupils are equal, round, and reactive to light.  Neck: Normal range of motion and full passive range of motion without pain. Neck supple. No muscular tenderness present.  Cardiovascular: Normal rate, regular rhythm, normal heart sounds, intact distal pulses and normal pulses.  Exam reveals no gallop and no friction rub.   No murmur heard. Pulmonary/Chest: Effort normal and breath sounds normal.  Abdominal: Soft. Normal appearance and bowel sounds are normal. There is tenderness. There is no CVA tenderness.  Diffuse tenderness, no guarding, rebound, or referred.  Lymphadenopathy:    She has no cervical adenopathy.       Right: No supraclavicular adenopathy present.       Left: No supraclavicular adenopathy present.  Neurological: She is alert and oriented to person, place, and time. She has normal strength. No cranial nerve deficit or sensory deficit. She displays a negative Romberg sign. Gait normal.  Skin: Skin is warm and dry. No rash noted. No cyanosis. No pallor. Nails show no clubbing.  Psychiatric: She has a normal mood and affect. Her speech is normal and behavior is normal. Judgment and thought content normal. Cognition and memory are normal.   BP 128/74  mmHg  Pulse 88  Temp(Src) 98.3 F (36.8 C) (Oral)  Resp 16  Wt 225 lb 12.8 oz (102.422 kg)  SpO2 98%  LMP 10/05/2014 (Approximate)      Assessment & Plan:  Headache, unspecified headache type - Plan: aspirin-acetaminophen-caffeine (EXCEDRIN MIGRAINE) 250-250-65 MG tablet  Non-intractable vomiting with nausea, vomiting of unspecified type - Plan: ondansetron (ZOFRAN) 4 MG tablet, CBC with Differential/Platelet  History of cirrhosis - Plan: CBC with  Differential/Platelet, Comprehensive metabolic panel  She does not appear to have an injury from fainting this morning. Discussed that her neurological exam is normal. Suspect that her nausea and vomiting were associated with her headache that she describes as her usual headache. Her headache appears to have resolved however nausea persist keeping her from eating and hydrating. Will prescribe Zofran for nausea and then the plan is for her to be able to drink fluids and eat a bland diet for the next 24 hours. She does not appear infectious, not sure what the elevated temperature was due to however this appears to have resolved. Prescription for Excedrin Migraine sent to pharmacy per patient request to see if this is cheaper than over-the-counter. Recommend that she keep this on hand in case of headache since this has worked for her in past. Discussed keeping up with headache triggers and emphasized importance of eating regular meals and staying hydrated and getting regular sleep. Recommend that she schedule an appointment with her gastroenterologist to follow up on cirrhosis. Discussed increased risk of liver cancer due to cirrhosis and the need for consistent follow-up. Will follow up pending lab results.

## 2015-07-18 NOTE — Patient Instructions (Signed)
I recommend taking the ondansetron (Zofran) for your nausea and try hydrating and eating a bland diet for next 24 hours. Your nausea and vomiting appears to be directly related to your headache. I recommend taking Excedrin migraine at onset of headache since this has worked for you in the past. Avoid skipping meals and drink 6-8 glasses of water daily.

## 2015-07-19 LAB — CBC WITH DIFFERENTIAL/PLATELET
BASOS ABS: 0 10*3/uL (ref 0.0–0.1)
Basophils Relative: 0 % (ref 0–1)
EOS ABS: 0.1 10*3/uL (ref 0.0–0.7)
EOS PCT: 1 % (ref 0–5)
HEMATOCRIT: 41.8 % (ref 36.0–46.0)
Hemoglobin: 14.3 g/dL (ref 12.0–15.0)
Lymphocytes Relative: 34 % (ref 12–46)
Lymphs Abs: 2.1 10*3/uL (ref 0.7–4.0)
MCH: 32.1 pg (ref 26.0–34.0)
MCHC: 34.2 g/dL (ref 30.0–36.0)
MCV: 93.9 fL (ref 78.0–100.0)
Monocytes Absolute: 0.6 10*3/uL (ref 0.1–1.0)
Monocytes Relative: 10 % (ref 3–12)
Neutro Abs: 3.4 10*3/uL (ref 1.7–7.7)
Neutrophils Relative %: 55 % (ref 43–77)
PLATELETS: 53 10*3/uL — AB (ref 150–400)
RBC: 4.45 MIL/uL (ref 3.87–5.11)
RDW: 17.5 % — AB (ref 11.5–15.5)
WBC: 6.2 10*3/uL (ref 4.0–10.5)

## 2015-07-21 ENCOUNTER — Encounter (HOSPITAL_COMMUNITY): Payer: Self-pay

## 2015-07-21 ENCOUNTER — Emergency Department (HOSPITAL_COMMUNITY): Payer: BLUE CROSS/BLUE SHIELD

## 2015-07-21 ENCOUNTER — Emergency Department (HOSPITAL_COMMUNITY)
Admission: EM | Admit: 2015-07-21 | Discharge: 2015-07-21 | Disposition: A | Discharge status: Home / Self Care | Payer: BLUE CROSS/BLUE SHIELD | Attending: Emergency Medicine | Admitting: Emergency Medicine

## 2015-07-21 DIAGNOSIS — Z79899 Other long term (current) drug therapy: Secondary | ICD-10-CM | POA: Insufficient documentation

## 2015-07-21 DIAGNOSIS — J40 Bronchitis, not specified as acute or chronic: Secondary | ICD-10-CM | POA: Diagnosis not present

## 2015-07-21 DIAGNOSIS — Z8679 Personal history of other diseases of the circulatory system: Secondary | ICD-10-CM | POA: Insufficient documentation

## 2015-07-21 DIAGNOSIS — R112 Nausea with vomiting, unspecified: Secondary | ICD-10-CM | POA: Insufficient documentation

## 2015-07-21 DIAGNOSIS — Z862 Personal history of diseases of the blood and blood-forming organs and certain disorders involving the immune mechanism: Secondary | ICD-10-CM | POA: Diagnosis not present

## 2015-07-21 DIAGNOSIS — Z792 Long term (current) use of antibiotics: Secondary | ICD-10-CM | POA: Insufficient documentation

## 2015-07-21 DIAGNOSIS — Z8719 Personal history of other diseases of the digestive system: Secondary | ICD-10-CM | POA: Insufficient documentation

## 2015-07-21 DIAGNOSIS — F17210 Nicotine dependence, cigarettes, uncomplicated: Secondary | ICD-10-CM | POA: Insufficient documentation

## 2015-07-21 DIAGNOSIS — Z87738 Personal history of other specified (corrected) congenital malformations of digestive system: Secondary | ICD-10-CM | POA: Insufficient documentation

## 2015-07-21 DIAGNOSIS — R05 Cough: Secondary | ICD-10-CM | POA: Diagnosis present

## 2015-07-21 LAB — CBC WITH DIFFERENTIAL/PLATELET
BASOS PCT: 0 %
Basophils Absolute: 0 10*3/uL (ref 0.0–0.1)
EOS PCT: 1 %
Eosinophils Absolute: 0 10*3/uL (ref 0.0–0.7)
HEMATOCRIT: 40.7 % (ref 36.0–46.0)
Hemoglobin: 13.8 g/dL (ref 12.0–15.0)
Lymphocytes Relative: 25 %
Lymphs Abs: 1.4 10*3/uL (ref 0.7–4.0)
MCH: 32.7 pg (ref 26.0–34.0)
MCHC: 33.9 g/dL (ref 30.0–36.0)
MCV: 96.4 fL (ref 78.0–100.0)
MONO ABS: 0.5 10*3/uL (ref 0.1–1.0)
MONOS PCT: 9 %
NEUTROS ABS: 3.7 10*3/uL (ref 1.7–7.7)
Neutrophils Relative %: 65 %
PLATELETS: 43 10*3/uL — AB (ref 150–400)
RBC: 4.22 MIL/uL (ref 3.87–5.11)
RDW: 17.3 % — AB (ref 11.5–15.5)
WBC: 5.6 10*3/uL (ref 4.0–10.5)

## 2015-07-21 LAB — BASIC METABOLIC PANEL
ANION GAP: 9 (ref 5–15)
BUN: 6 mg/dL (ref 6–20)
CO2: 22 mmol/L (ref 22–32)
Calcium: 9.2 mg/dL (ref 8.9–10.3)
Chloride: 109 mmol/L (ref 101–111)
Creatinine, Ser: 0.65 mg/dL (ref 0.44–1.00)
GFR calc Af Amer: >60 mL/min (ref >60)
GLUCOSE: 100 mg/dL — AB (ref 65–99)
Potassium: 3.5 mmol/L (ref 3.5–5.1)
Sodium: 140 mmol/L (ref 135–145)

## 2015-07-21 MED ORDER — NAPROXEN 500 MG PO TABS: 500.0000 mg | ORAL_TABLET | Freq: Two times a day (BID) | ORAL | Status: AC

## 2015-07-21 MED ORDER — DM-GUAIFENESIN ER 30-600 MG PO TB12: 1.0000 | ORAL_TABLET | Freq: Two times a day (BID) | ORAL | Status: AC

## 2015-07-21 NOTE — Discharge Instructions (Signed)
Continue usual albuterol inhaler 2 puffs every 6 hours. Take Mucinex DM for the cough and phlegm. The Naprosyn which is an anti-inflammatory may help improve your voice some. Return for any new or worse symptoms. Chest x-ray negative for pneumonia.

## 2015-07-21 NOTE — ED Notes (Signed)
[facility] same and given inhaler and steroid shot, also went to [facility] and got an antibiotic. Have one more dose left. Is still feeling SOB and has no voice.

## 2015-07-21 NOTE — ED Provider Notes (Signed)
CSN: [id number]     Arrival date & time 07/21/15  [id number] History   First MD Initiated Contact with Patient 07/21/15 0915     Chief Complaint  Patient presents with  . Cough  . Hoarse  . Shortness of Breath     (Consider location/radiation/quality/duration/timing/severity/associated sxs/prior Treatment) Patient is a 40 y.o. female presenting with cough and shortness of breath. The history is provided by the patient.  Cough Associated symptoms: shortness of breath   Associated symptoms: no chest pain, no fever, no myalgias and no rash   Shortness of Breath Associated symptoms: cough and vomiting   Associated symptoms: no chest pain, no fever and no rash    patient with upper respiratory symptoms congestion and hoarse voice shortness of breath since January 2. Patient been seen in urgent care and by primary care doctor for same. Treated with inhaler albuterol steroid shot and antibiotics without significant improvement. But the mid course there was a little bit of improvement but the symptoms didn't completely resolve. Patient states through a syncopal episode several days ago. Has been none since. Associated with some nausea and vomiting but no diarrhea. Room air sats here or 99%.  Past Medical History  Diagnosis Date  . Hepatic cirrhosis (HCC)   . Anemia   . Splenomegaly   . Esophageal varices (HCC)   . Portal venous hypertension (HCC)   . Gastric varices   . Appendicolith   . Biliary atresia   . Migraines    Past Surgical History  Procedure Laterality Date  . Liver surgery      as a baby  . Ovarian cyst removal    . Knee sugery Right     rod from knee to ankle,   . Hand surgery Left     rod from hand to elbow.  . Cholecystectomy      at 1-2 months old for biliary atresia   Family History  Problem Relation Age of Onset  . Hypertension Mother   . Hypertension Sister   . Breast cancer Maternal Grandmother   . Colon cancer Neg Hx    Social History  Substance Use Topics   . Smoking status: Current Every Day Smoker -- 0.25 packs/day for 10 years    Types: Cigarettes  . Smokeless tobacco: Never Used     Comment: tobacco info given 11/09/13  . Alcohol Use: No     Comment: former   OB History    Gravida Para Term Preterm AB TAB SAB Ectopic Multiple Living   0              Review of Systems  Constitutional: Negative for fever.  HENT: Positive for congestion and voice change.   Eyes: Negative for redness and visual disturbance.  Respiratory: Positive for cough and shortness of breath.   Cardiovascular: Negative for chest pain.  Gastrointestinal: Positive for nausea and vomiting. Negative for diarrhea.  Genitourinary: Negative for dysuria.  Musculoskeletal: Negative for myalgias.  Skin: Negative for rash.  Neurological: Negative for syncope.  Hematological: Does not bruise/bleed easily.  Psychiatric/Behavioral: Negative for confusion.      Allergies  Review of patient's allergies indicates no known allergies.  Home Medications   Prior to Admission medications   Medication Sig Start Date End Date Taking? Authorizing Provider  albuterol (PROVENTIL HFA;VENTOLIN HFA) 108 (90 Base) MCG/ACT inhaler Inhale 2 puffs into the lungs every 6 (six) hours as needed for wheezing or shortness of breath. 07/07/15  Yes Linna HoffJames D Kindl, MD  levofloxacin (LEVAQUIN) 500 MG tablet Take 1 tablet (500 mg total) by mouth daily. 07/07/15  Yes Linna Hoff, MD  dextromethorphan-guaiFENesin [facility] DM) 30-600 MG 12hr tablet Take 1 tablet by mouth 2 (two) times daily. 07/21/15   Vanetta Mulders, MD  naproxen (NAPROSYN) 500 MG tablet Take 1 tablet (500 mg total) by mouth 2 (two) times daily. 07/21/15   Vanetta Mulders, MD  ondansetron (ZOFRAN) 4 MG tablet Take 1 tablet (4 mg total) by mouth every 8 (eight) hours as needed for nausea or vomiting. 07/18/15   Vickie L Henson, NP   BP 109/57 mmHg  Pulse 94  Temp(Src) 98 F (36.7 C) (Oral)  Resp 25  Wt 102.059 kg  SpO2 97%  LMP  10/05/2014 (Approximate) Physical Exam  Constitutional: She is oriented to person, place, and time. She appears well-developed and well-nourished. No distress.  HENT:  Head: Normocephalic and atraumatic.  Mouth/Throat: Oropharynx is clear and moist. No oropharyngeal exudate.  Eyes: Conjunctivae and EOM are normal. Pupils are equal, round, and reactive to light.  Neck: Normal range of motion. Neck supple.  Cardiovascular: Normal rate, regular rhythm and normal heart sounds.   No murmur heard. Pulmonary/Chest: Effort normal and breath sounds normal. She has no wheezes.  Abdominal: Soft. Bowel sounds are normal. There is no tenderness.  Musculoskeletal: Normal range of motion.  Neurological: She is alert and oriented to person, place, and time. No cranial nerve deficit. She exhibits normal muscle tone. Coordination normal.  Skin: Skin is warm. No rash noted.  Nursing note and vitals reviewed.   ED Course  Procedures (including critical care time) Labs Review Labs Reviewed  BASIC METABOLIC PANEL - Abnormal; Notable for the following:    Glucose, Bld 100 (*)    All other components within normal limits  CBC WITH DIFFERENTIAL/PLATELET - Abnormal; Notable for the following:    RDW 17.3 (*)    Platelets 43 (*)    All other components within normal limits    Imaging Review Dg Chest 2 View  07/21/2015  CLINICAL DATA:  Productive cough for 2 weeks.  Shortness of breath EXAM: CHEST  2 VIEW COMPARISON:  07/07/2015 FINDINGS: Chronic interstitial coarsening. There is no edema, consolidation, effusion, or pneumothorax. Normal heart size and aortic contours. IMPRESSION: No active cardiopulmonary disease. Electronically Signed   By: Marnee Spring M.D.   On: 07/21/2015 07:29   I have personally reviewed and evaluated these images and lab results as part of my medical decision-making.   EKG Interpretation   Date/Time:  Monday July 21 2015 06:54:08 EST Ventricular Rate:  89 PR Interval:   156 QRS Duration: 92 QT Interval:  382 QTC Calculation: 464 R Axis:   53 Text Interpretation:  Normal sinus rhythm Minimal voltage criteria for  LVH, may be normal variant Borderline ECG Confirmed by Shakeema Lippman  MD,  Hydeia Mcatee ([id number]) on 07/21/2015 9:23:34 AM      MDM   Final diagnoses:  Bronchitis    The patient with upper respiratory type symptoms bronchitis voice hoarseness since January 2. Patient seen in urgent care also seen by regular physician. Patient treated with albuterol inhaler had a steroid shot early on and also was started on antibiotics all these have completed and patient still not any better. Patient chest x-ray negative for pneumonia labs without any significant abnormalities. This seems to be consistent with a viral bronchitis. Patient did have some improvement midcourse on the illness so it's possible there could be a secondary upper respiratory infection.  Will treat symptomatically with Naprosyn and Mucinex DM and continue her albuterol inhaler.    Vanetta Mulders, MD 07/21/15 1144

## 2015-07-22 ENCOUNTER — Telehealth: Payer: Self-pay | Admitting: Family Medicine

## 2015-07-22 NOTE — Telephone Encounter (Signed)
Pt stopped by and stated that she still has not returned to work. She states she still has no voice. 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 Pt can be reached at [phone].

## 2015-07-22 NOTE — Telephone Encounter (Signed)
I discussed this message with Lafonda Mosses. If any paperwork comes in or any more phone calls regarding disability paperwork. Please forward this to [facility]. I did not take this patient out of work and will not be signing disability paperwork that I did so. I made it clear to the patient that she took herself out of work.

## 2015-07-24 ENCOUNTER — Telehealth: Payer: Self-pay

## 2015-07-24 NOTE — Telephone Encounter (Signed)
This patient was examined by me on Friday 07/18/15 and her voice was normal. I congratulated the patient on her voice being back to normal and her respiratory exam was also normal. This is the last visit I had with her. She did go to the ED for complaints of hoarseness but I have not seen her since. I cannot approve disability for this patient based on this information. Thanks.

## 2015-07-24 NOTE — Telephone Encounter (Signed)
Gave note and forms to [place] for her review

## 2015-07-24 NOTE — Telephone Encounter (Signed)
Pt sent letter in the mail requesting disability for loss of voice. It will be going back in your folder.

## 2015-07-24 NOTE — Telephone Encounter (Signed)
Paperwork came in today. Gave to [place] to look over

## 2015-07-28 ENCOUNTER — Encounter: Payer: Self-pay | Admitting: Family Medicine

## 2015-07-28 ENCOUNTER — Ambulatory Visit (INDEPENDENT_AMBULATORY_CARE_PROVIDER_SITE_OTHER): Payer: BLUE CROSS/BLUE SHIELD | Admitting: Family Medicine

## 2015-07-28 VITALS — BP 128/70 | HR 72 | Wt 226.2 lb

## 2015-07-28 DIAGNOSIS — Z09 Encounter for follow-up examination after completed treatment for conditions other than malignant neoplasm: Secondary | ICD-10-CM | POA: Diagnosis not present

## 2015-07-28 NOTE — Progress Notes (Signed)
   Subjective:    Patient ID: Dominique Christian, female    DOB: 10/01/1975, 40 y.o.   MRN: [id number]  HPI Chief Complaint  Patient presents with  . follow-up    follow-up. getting voice back and feeling alot better   Patient states she is here for follow-up on paperwork and that she is back to normal as far as shortness of breath, cough, hoarseness. She is requesting that I retroactively give her time off from work by signing short-term disability paperwork.  Patient was seen in our office by me for the first time on July 09 2015 for cough, wheezing. She was seen 2 days prior to that at urgent care and was prescribed antibiotic and steroids and did not pick up those prescriptions prior to seeing me. Patient was treated by me on January 4 and given a work note for January 4 and 5th. I then did not see the patient again in the office until January 13 and this was for a different complaint. Her respiratory exam was normal on that date and her voice was back to normal. She states that her cough, hoarseness and shortness of breath returned and was seen at the emergency department on January 16. She did not call to schedule appointment with me and our office was open on that date.  I have not seen the patient since January 13 until today.  She denies fever, chills, cough, shortness of breath, syncope, headache.  Dr Dickie La is liver doctor- she has not yet scheduled a follow-up appointment as I recommended. She is aware that her lab values were abnormal regarding her liver.    Review of Systems Pertinent positives and negatives in the history of present illness.    Objective:   Physical Exam BP 128/70 mmHg  Pulse 72  Wt 226 lb 3.2 oz (102.604 kg)  LMP 10/05/2014 (Approximate)  Alert and in no distress. Pharyngeal area is mildly erythematous.  Cardiac exam shows a regular sinus rhythm without murmurs or gallops. Lungs are clear to auscultation.     Assessment & Plan:  Follow up  Patient is  aware that the only dates off from work provided by our office will be January 4, 5, and 13th 2017. Lafonda Mosses, office manager, was present during our discussion. Discussed that she delayed her recovery by not starting her antibiotic and steroid as prescribed by the urgent care. Also discussed that she chose to not go to work and that I did not take her out of work for more than 2 days, as previously mentioned, and cannot retroactively sign disability paperwork. This has been explained to the patient on multiple occasions.  Discussed that by her seeing multiple providers, her care and documentation is not consistent and is disjointed. Explained that I cannot attest to what other providers found on her exam and that she would need to get proper documentation from those providers.  Once again, I recommend that the patient follow up with Dr. Dickie La for abnormal liver values and history of cirrhosis.

## 2015-07-29 NOTE — Telephone Encounter (Signed)
We have agreed to excuse for a total of 3 days.   2 at one episode and 1 at another when she passed out.

## 2015-08-19 ENCOUNTER — Telehealth: Payer: Self-pay | Admitting: Family Medicine

## 2015-08-19 NOTE — Telephone Encounter (Signed)
Pt called stating that [employer] told her that they have not received FMLA forms from [employer] yet. Pt says it may be because [employer] require her claim # to be on each page of the form. Claim # to be on each page is [id number]. Then fax to [phone]. Pt wants to informed when this is done

## 2015-08-20 ENCOUNTER — Telehealth: Payer: Self-pay | Admitting: Family Medicine

## 2015-08-20 NOTE — Telephone Encounter (Signed)
FMLA form was refaxed to [facility] today with the claim # on each page. Called pt and let her know that form was faxed.

## 2015-08-20 NOTE — Telephone Encounter (Signed)
Pt called and requested a call back. She has questions concerning Short Term Disability papers completed. Please call pt at [phone].

## 2015-08-20 NOTE — Telephone Encounter (Signed)
Please contact patient and find out what her concerns are. I have made it clear to the patient on multiple occasions that I did not take her out of work and will not approve short-term disability.

## 2015-08-21 ENCOUNTER — Telehealth (HOSPITAL_COMMUNITY): Payer: Self-pay | Admitting: Emergency Medicine

## 2015-08-21 NOTE — Telephone Encounter (Signed)
Called pt, man said he will have her call shortly.

## 2015-08-21 NOTE — ED Notes (Signed)
Pt came in to get excuse note for DOV on 07/07/15 Provided pt same note that was given to her on that visit

## 2015-08-21 NOTE — Telephone Encounter (Signed)
Pt came into the office after I had left her a message.  Martie Lee and I talked with her.  She states she now has a Veterinary surgeon that she is using [facility] [phone] who is telling her that her issues are mental not physical. She states she had SOB from 1/9- 07/17/15.  Passed out on 07/18/15.  Couldn't sleep.  Nausea and vomiting are stress related.  Counselor said he would take her out atleast another 30 days and would send note for 1/9-12.  She states her boss said she could not work 1/9-12.  I advised to have her boss write letter to send to short term.  Pt will bring letter from boss and oow notes from ER and urgent care on Monday to send all at once again with the FMLA FORMS we have.  Pt signed records release and HIPAA release for counselor.

## 2015-08-21 NOTE — Telephone Encounter (Signed)
Thanks for keeping me in the loop.

## 2015-08-26 ENCOUNTER — Telehealth: Payer: Self-pay | Admitting: Family Medicine

## 2015-08-26 NOTE — Telephone Encounter (Signed)
Records received form [facility] 08/26/2015. Sending back for review.

## 2015-09-03 ENCOUNTER — Encounter: Payer: Self-pay | Admitting: Family Medicine

## 2015-09-03 DIAGNOSIS — F411 Generalized anxiety disorder: Secondary | ICD-10-CM | POA: Insufficient documentation

## 2015-09-03 DIAGNOSIS — F410 Panic disorder [episodic paroxysmal anxiety] without agoraphobia: Secondary | ICD-10-CM | POA: Insufficient documentation

## 2015-09-05 ENCOUNTER — Telehealth: Payer: Self-pay | Admitting: Family Medicine

## 2015-09-05 NOTE — Telephone Encounter (Signed)
Records received from [facility]. Sending back for review please note what needs to be abstracted or scanned.

## 2015-09-10 ENCOUNTER — Encounter: Payer: Self-pay | Admitting: Family Medicine

## 2015-09-11 ENCOUNTER — Encounter: Payer: Self-pay | Admitting: Family Medicine

## 2015-10-23 ENCOUNTER — Encounter: Payer: Self-pay | Admitting: Family Medicine

## 2016-09-17 IMAGING — CR DG CHEST 2V
2 series · 2 of 2 positions shown · non-contrast
Comparison: 07/07/2015

CLINICAL DATA: Productive cough for 2 weeks.  Shortness of breath

EXAM:
CHEST  2 VIEW

[chest pa]
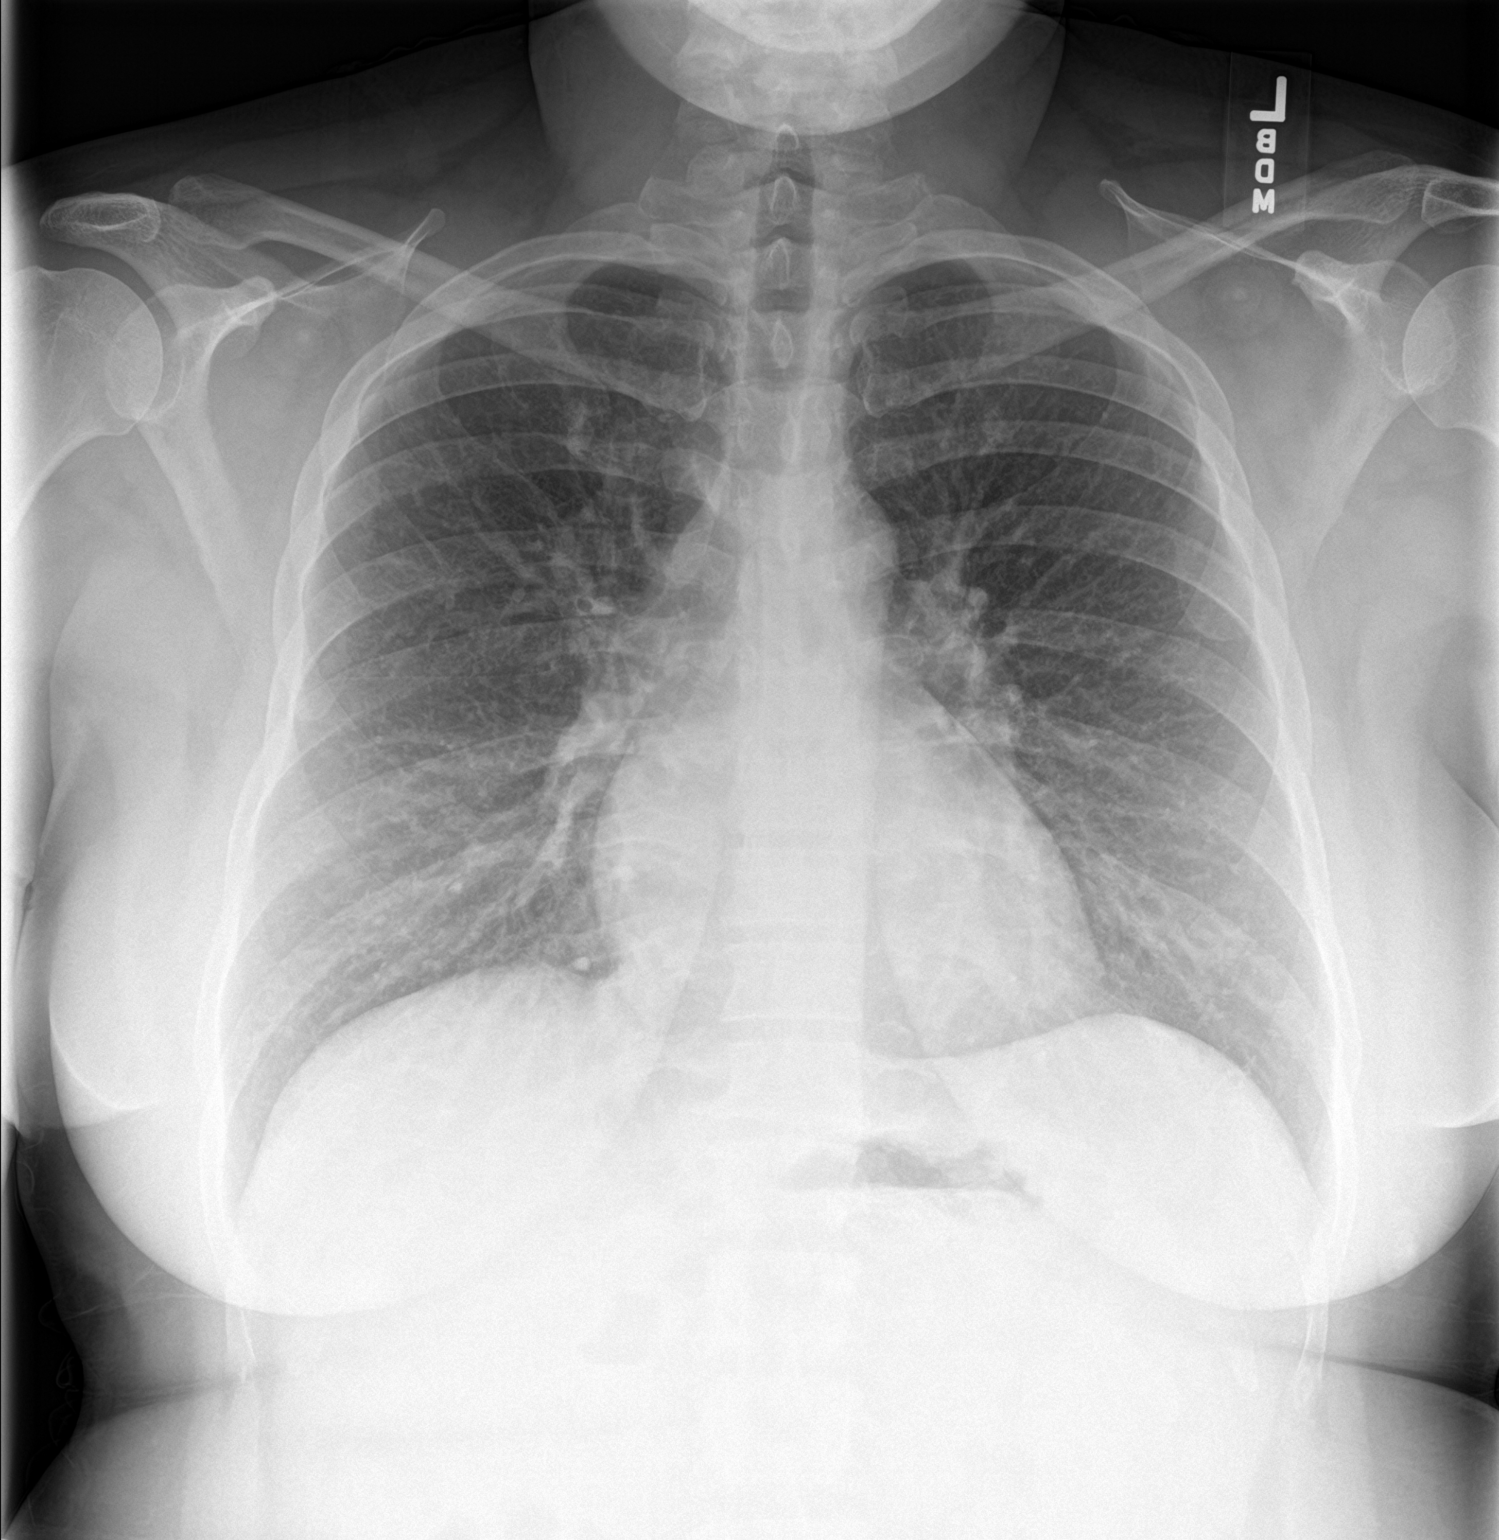

[chest lat]
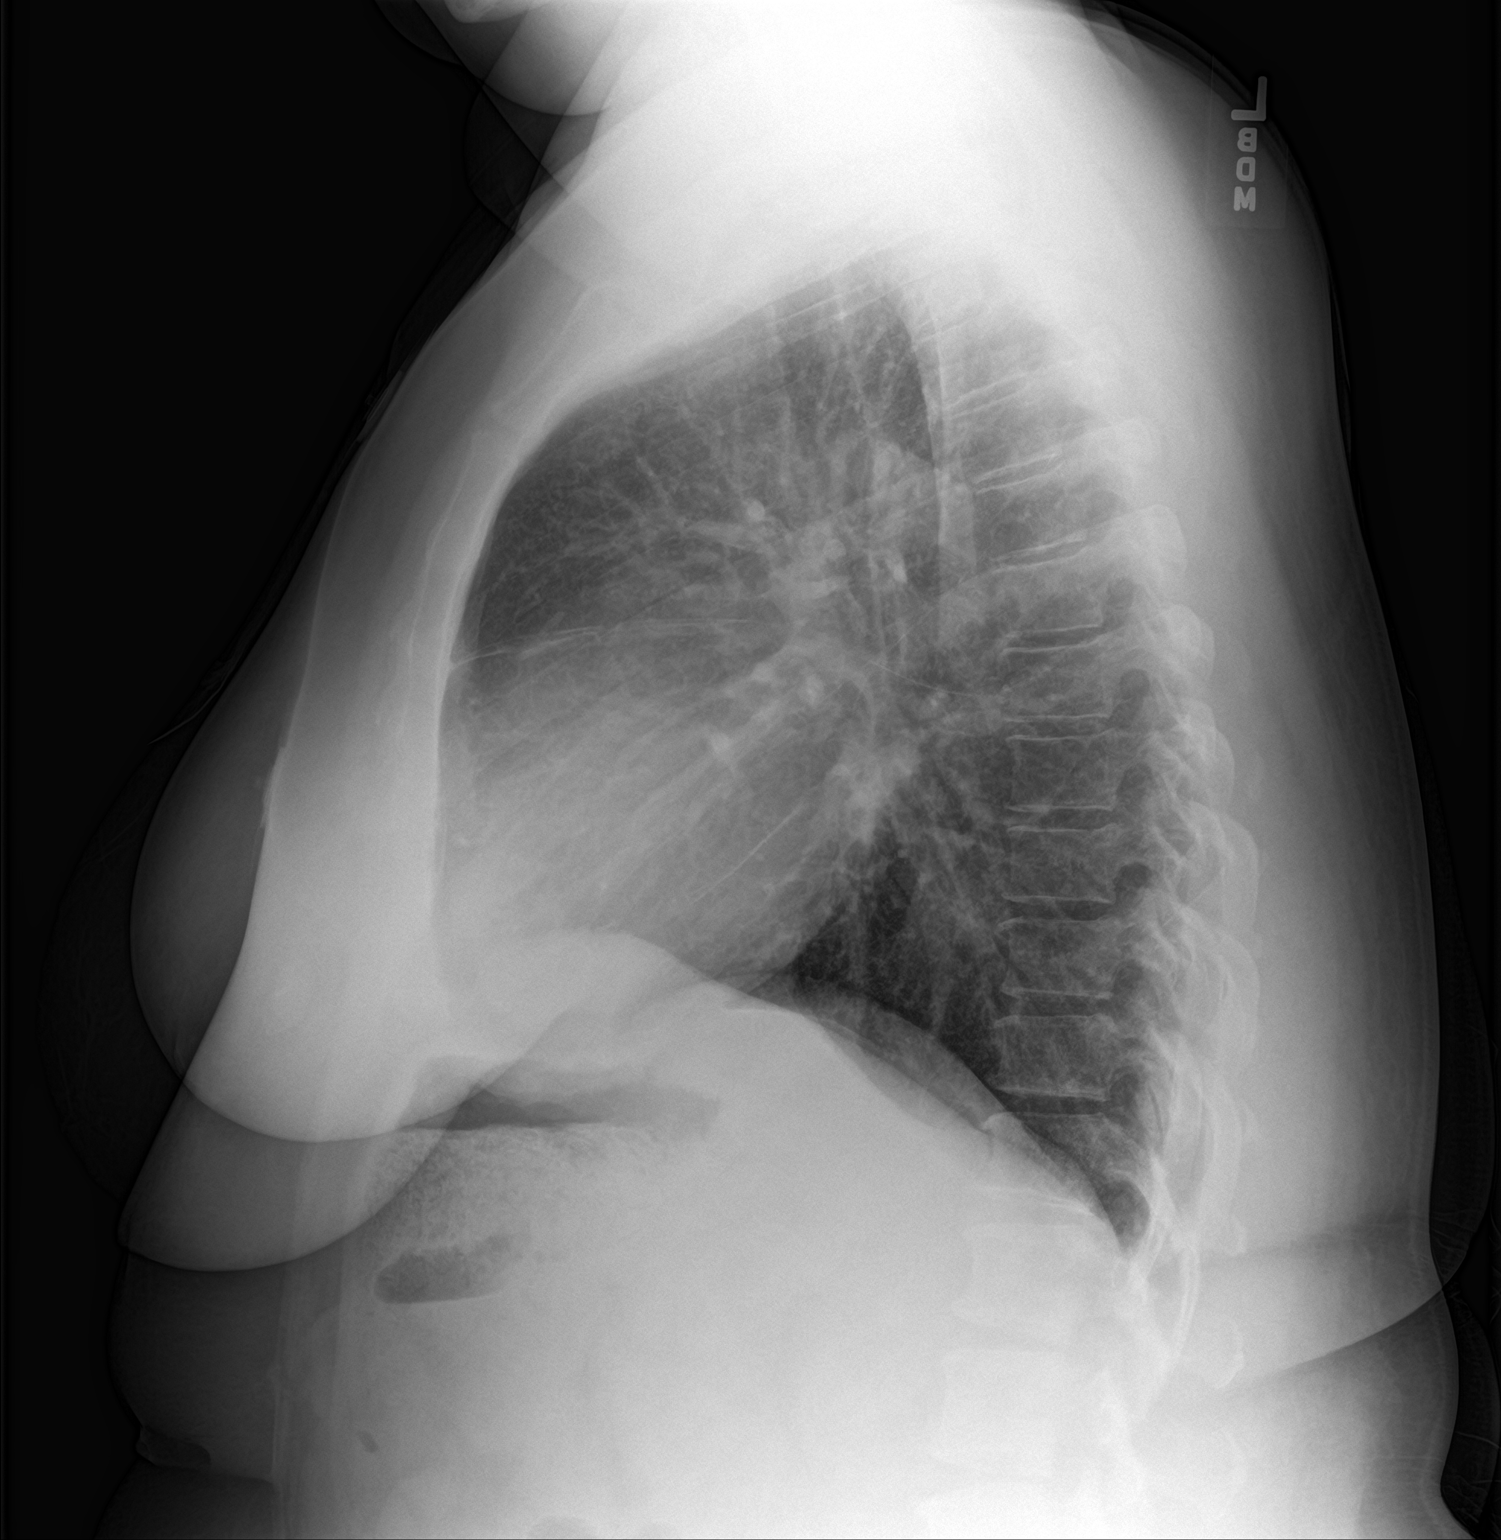

[2 of 2 positions shown; findings below may reference images not displayed]

FINDINGS: Chronic interstitial coarsening. There is no edema, consolidation,
effusion, or pneumothorax. Normal heart size and aortic contours.
IMPRESSION: No active cardiopulmonary disease.

## 2016-09-21 ENCOUNTER — Encounter: Payer: Self-pay | Admitting: Family Medicine

## 2016-10-25 ENCOUNTER — Encounter: Payer: Self-pay | Admitting: Family Medicine

## 2019-10-28 ENCOUNTER — Encounter (HOSPITAL_COMMUNITY): Payer: Self-pay | Admitting: Emergency Medicine

## 2019-10-28 ENCOUNTER — Other Ambulatory Visit: Payer: Self-pay

## 2019-10-28 DIAGNOSIS — Z79899 Other long term (current) drug therapy: Secondary | ICD-10-CM | POA: Diagnosis not present

## 2019-10-28 DIAGNOSIS — K642 Third degree hemorrhoids: Secondary | ICD-10-CM | POA: Diagnosis not present

## 2019-10-28 DIAGNOSIS — F17210 Nicotine dependence, cigarettes, uncomplicated: Secondary | ICD-10-CM | POA: Insufficient documentation

## 2019-10-28 DIAGNOSIS — K649 Unspecified hemorrhoids: Secondary | ICD-10-CM | POA: Diagnosis present

## 2019-10-28 NOTE — ED Triage Notes (Signed)
Pt reports she has hemorrhoid and reports for week having pains with sitting and having BMs. Reports tried OTC but not helping with pains. Reports bleeding with BMs.

## 2019-10-29 ENCOUNTER — Encounter (HOSPITAL_COMMUNITY): Payer: Self-pay | Admitting: Emergency Medicine

## 2019-10-29 ENCOUNTER — Emergency Department (HOSPITAL_COMMUNITY)
Admission: EM | Admit: 2019-10-29 | Discharge: 2019-10-29 | Disposition: A | Discharge status: Home / Self Care | Payer: BC Managed Care – PPO | Attending: Emergency Medicine | Admitting: Emergency Medicine

## 2019-10-29 DIAGNOSIS — K642 Third degree hemorrhoids: Secondary | ICD-10-CM

## 2019-10-29 MED ORDER — MELOXICAM 7.5 MG PO TABS: 7.5000 mg | ORAL_TABLET | Freq: Every day | ORAL | 0 refills | Status: AC

## 2019-10-29 MED ORDER — LIDOCAINE HCL URETHRAL/MUCOSAL 2 % EX GEL
1.0000 "application " | Freq: Once | CUTANEOUS | Status: AC
  Administered 2019-10-29: 1 via TOPICAL
  Filled 2019-10-29: qty 30

## 2019-10-29 MED ORDER — PROCTOFOAM HC 1-1 % EX FOAM: 1.0000 | Freq: Two times a day (BID) | CUTANEOUS | 0 refills | Status: AC

## 2019-10-29 MED ORDER — IBUPROFEN 800 MG PO TABS
800.0000 mg | ORAL_TABLET | Freq: Once | ORAL | Status: AC
  Administered 2019-10-29: 800 mg via ORAL
  Filled 2019-10-29: qty 1

## 2019-10-29 MED ORDER — ACETAMINOPHEN 500 MG PO TABS: 1000.0000 mg | ORAL_TABLET | Freq: Once | ORAL | Status: DC

## 2019-10-29 NOTE — ED Provider Notes (Signed)
[facility] DEPT Provider Note   CSN: [id number] Arrival date & time: 10/28/19  1824     History Chief Complaint  Patient presents with  . Hemorrhoids    Dominique Christian is a 44 y.o. female.  The history is provided by the patient.  Rectal Bleeding Quality: scant with wiping secondary to hemorrhoid with pain and itching.   Amount:  Scant Duration:  1 week Timing:  Constant Chronicity:  New Context: defecation and hemorrhoids   Context: not anal fissures   Similar prior episodes: yes   Relieved by:  Nothing Worsened by:  Nothing Ineffective treatments:  Hemorrhoid cream and sitz baths Associated symptoms: no abdominal pain, no dizziness and no fever   Risk factors: no anticoagulant use        Past Medical History:  Diagnosis Date  . Anemia   . Appendicolith   . Biliary atresia   . Esophageal varices ([place])   . Gastric varices   . Hepatic cirrhosis ([place])   . Migraines   . Portal venous hypertension ([facility])   . Splenomegaly     Patient Active Problem List   Diagnosis Date Noted  . Generalized anxiety disorder 09/03/2015  . Panic attacks 09/03/2015  . Sepsis ([place]) 10/02/2013  . UTI (urinary tract infection) 10/02/2013  . Bilirubinemia 10/02/2013  . Abdominal pain 10/02/2013  . Tobacco abuse 10/02/2013  . GERD (gastroesophageal reflux disease) 10/02/2013  . Transaminitis 10/02/2013    Past Surgical History:  Procedure Laterality Date  . CHOLECYSTECTOMY     at 1-2 months old for biliary atresia  . ESOPHAGOGASTRODUODENOSCOPY  11/14/13   normal upper endoscopy  . HAND SURGERY Left    rod from hand to elbow.  . knee sugery Right    rod from knee to ankle,   . LIVER SURGERY     as a baby  . OVARIAN CYST REMOVAL       OB History    Gravida  0   Para      Term      Preterm      AB      Living        SAB      TAB      Ectopic      Multiple      Live Births              Family History  Problem  Relation Age of Onset  . Breast cancer Maternal Grandmother   . Hypertension Mother   . Hypertension Sister   . Colon cancer Neg Hx     Social History   Tobacco Use  . Smoking status: Current Every Day Smoker    Packs/day: 0.25    Years: 10.00    Pack years: 2.50    Types: Cigarettes  . Smokeless tobacco: Never Used  . Tobacco comment: tobacco info given 11/09/13  Substance Use Topics  . Alcohol use: No    Comment: former  . Drug use: No    Home Medications Prior to Admission medications   Medication Sig Start Date End Date Taking? Authorizing Provider  albuterol (PROVENTIL HFA;VENTOLIN HFA) 108 (90 Base) MCG/ACT inhaler Inhale 2 puffs into the lungs every 6 (six) hours as needed for wheezing or shortness of breath. 07/07/15   Billy Fischer, MD  dextromethorphan-guaiFENesin (MUCINEX DM) 30-600 MG 12hr tablet Take 1 tablet by mouth 2 (two) times daily. 07/21/15   Fredia Sorrow, MD  naproxen (NAPROSYN) 500 MG  tablet Take 1 tablet (500 mg total) by mouth 2 (two) times daily. 07/21/15   Vanetta Mulders, MD  ondansetron (ZOFRAN) 4 MG tablet Take 1 tablet (4 mg total) by mouth every 8 (eight) hours as needed for nausea or vomiting. 07/18/15   Henson, Zorita Pang, NP-C    Allergies    Patient has no known allergies.  Review of Systems   Review of Systems  Constitutional: Negative for fever.  HENT: Negative for congestion.   Eyes: Negative for visual disturbance.  Respiratory: Negative for shortness of breath.   Gastrointestinal: Positive for hematochezia. Negative for abdominal pain.  Genitourinary: Negative for difficulty urinating.  Musculoskeletal: Negative for arthralgias.  Skin: Negative for color change.  Neurological: Negative for dizziness.  Psychiatric/Behavioral: Negative for agitation.  All other systems reviewed and are negative.   Physical Exam Updated Vital Signs BP (!) 147/80 (BP Location: Left Arm)   Pulse 90   Temp 98 F (36.7 C) (Oral)   Resp 18   SpO2  97%   Physical Exam Vitals and nursing note reviewed.  Constitutional:      General: She is not in acute distress.    Appearance: Normal appearance.  HENT:     Head: Normocephalic and atraumatic.     Nose: Nose normal.     Mouth/Throat:     Mouth: Mucous membranes are moist.     Pharynx: Oropharynx is clear.  Eyes:     Extraocular Movements: Extraocular movements intact.  Cardiovascular:     Rate and Rhythm: Normal rate and regular rhythm.     Pulses: Normal pulses.     Heart sounds: Normal heart sounds.  Pulmonary:     Effort: Pulmonary effort is normal.     Breath sounds: Normal breath sounds.  Abdominal:     General: Abdomen is flat. Bowel sounds are normal.     Tenderness: There is no abdominal tenderness. There is no guarding.  Genitourinary:    Comments: 3rd degree hemorrhoid at the 6 oclock position. Not thrombosed  Musculoskeletal:        General: Normal range of motion.     Cervical back: Normal range of motion and neck supple.  Skin:    General: Skin is warm and dry.     Capillary Refill: Capillary refill takes less than 2 seconds.  Neurological:     General: No focal deficit present.     Mental Status: She is alert and oriented to person, place, and time.  Psychiatric:        Mood and Affect: Mood normal.        Behavior: Behavior normal.     ED Results / Procedures / Treatments   Labs (all labs ordered are listed, but only abnormal results are displayed) Labs Reviewed - No data to display  EKG None  Radiology No results found.  Procedures Procedures (including critical care time)  Medications Ordered in ED Medications  ibuprofen (ADVIL) tablet 800 mg (has no administration in time range)  lidocaine (XYLOCAINE) 2 % jelly 1 application (has no administration in time range)    ED Course  I have reviewed the triage vital signs and the nursing notes.  Pertinent labs & imaging results that were available during my care of the patient were  reviewed by me and considered in my medical decision making (see chart for details).   Will start proctofoam and Nsaids as well as miralax.  No bleeding at this time.  Follow up with your PMD for  ongoing care.    Final Clinical Impression(s) / ED Diagnoses  Return for weakness, numbness, changes in vision or speech, fevers >100.4 unrelieved by medication, shortness of breath, intractable vomiting, or diarrhea, abdominal pain, Inability to tolerate liquids or food, cough, altered mental status or any concerns. No signs of systemic illness or infection. The patient is nontoxic-appearing on exam and vital signs are within normal limits.   I have reviewed the triage vital signs and the nursing notes. Pertinent labs &imaging results that were available during my care of the patient were reviewed by me and considered in my medical decision making (see chart for details).  After history, exam, and medical workup I feel the patient has been appropriately medically screened and is safe for discharge home. Pertinent diagnoses were discussed with the patient. Patient was givenstrictreturn precautions.      Giavana Rooke, MD 10/29/19 [phone]

## 2019-10-29 NOTE — Discharge Instructions (Signed)
Miralax one capful daily.   

## 2020-02-12 ENCOUNTER — Encounter: Payer: Self-pay | Admitting: Gastroenterology

## 2020-04-04 DEATH — deceased

## 2020-04-11 ENCOUNTER — Ambulatory Visit: Payer: BC Managed Care – PPO | Admitting: Gastroenterology
